# Patient Record
Sex: Female | Born: 1990 | Race: White | Hispanic: No | Marital: Single | Smoking: Former smoker
Health system: Southern US, Community
[De-identification: ages and names within clinical notes are randomized; demographics above are authoritative.]

## PROBLEM LIST (undated history)

## (undated) DIAGNOSIS — N390 Urinary tract infection, site not specified: Secondary | ICD-10-CM

## (undated) DIAGNOSIS — R569 Unspecified convulsions: Secondary | ICD-10-CM

## (undated) HISTORY — DX: Unspecified convulsions: R56.9

## (undated) HISTORY — PX: WISDOM TOOTH EXTRACTION: SHX21

## (undated) HISTORY — PX: NO PAST SURGERIES: SHX2092

## (undated) HISTORY — DX: Urinary tract infection, site not specified: N39.0

---
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
## 2008-03-20 ENCOUNTER — Emergency Department (HOSPITAL_COMMUNITY): Admission: EM | Admit: 2008-03-20 | Discharge: 2008-03-21 | Payer: Self-pay | Admitting: Emergency Medicine

## 2008-04-02 ENCOUNTER — Other Ambulatory Visit: Admission: RE | Admit: 2008-04-02 | Discharge: 2008-04-02 | Payer: Self-pay | Admitting: Family Medicine

## 2008-04-06 ENCOUNTER — Ambulatory Visit (HOSPITAL_COMMUNITY): Admission: RE | Admit: 2008-04-06 | Discharge: 2008-04-06 | Payer: Self-pay | Admitting: Family Medicine

## 2009-05-30 ENCOUNTER — Other Ambulatory Visit: Admission: RE | Admit: 2009-05-30 | Discharge: 2009-05-30 | Payer: Self-pay | Admitting: Family Medicine

## 2010-11-14 ENCOUNTER — Other Ambulatory Visit (HOSPITAL_COMMUNITY)
Admission: RE | Admit: 2010-11-14 | Discharge: 2010-11-14 | Disposition: A | Discharge status: Home / Self Care | Payer: BC Managed Care – PPO | Source: Ambulatory Visit | Attending: Family Medicine | Admitting: Family Medicine

## 2010-11-14 ENCOUNTER — Other Ambulatory Visit: Payer: Self-pay | Admitting: Physician Assistant

## 2010-11-14 DIAGNOSIS — Z113 Encounter for screening for infections with a predominantly sexual mode of transmission: Secondary | ICD-10-CM | POA: Insufficient documentation

## 2010-11-14 DIAGNOSIS — Z0000 Encounter for general adult medical examination without abnormal findings: Secondary | ICD-10-CM | POA: Insufficient documentation

## 2010-11-14 DIAGNOSIS — R87810 Cervical high risk human papillomavirus (HPV) DNA test positive: Secondary | ICD-10-CM | POA: Insufficient documentation

## 2010-12-29 ENCOUNTER — Other Ambulatory Visit: Payer: Self-pay | Admitting: Family Medicine

## 2012-05-09 ENCOUNTER — Other Ambulatory Visit (HOSPITAL_COMMUNITY)
Admission: RE | Admit: 2012-05-09 | Discharge: 2012-05-09 | Disposition: A | Discharge status: Home / Self Care | Payer: BC Managed Care – PPO | Source: Ambulatory Visit | Attending: Family Medicine | Admitting: Family Medicine

## 2012-05-09 ENCOUNTER — Other Ambulatory Visit: Payer: Self-pay | Admitting: Family Medicine

## 2012-05-09 DIAGNOSIS — Z113 Encounter for screening for infections with a predominantly sexual mode of transmission: Secondary | ICD-10-CM | POA: Insufficient documentation

## 2012-05-09 DIAGNOSIS — Z124 Encounter for screening for malignant neoplasm of cervix: Secondary | ICD-10-CM | POA: Insufficient documentation

## 2014-08-11 ENCOUNTER — Encounter (HOSPITAL_BASED_OUTPATIENT_CLINIC_OR_DEPARTMENT_OTHER): Payer: Self-pay | Admitting: *Deleted

## 2014-08-11 ENCOUNTER — Emergency Department (HOSPITAL_BASED_OUTPATIENT_CLINIC_OR_DEPARTMENT_OTHER)
Admission: EM | Admit: 2014-08-11 | Discharge: 2014-08-11 | Disposition: A | Discharge status: Home / Self Care | Payer: BC Managed Care – PPO | Attending: Emergency Medicine | Admitting: Emergency Medicine

## 2014-08-11 DIAGNOSIS — W01198A Fall on same level from slipping, tripping and stumbling with subsequent striking against other object, initial encounter: Secondary | ICD-10-CM | POA: Insufficient documentation

## 2014-08-11 DIAGNOSIS — Y92032 Bedroom in apartment as the place of occurrence of the external cause: Secondary | ICD-10-CM | POA: Diagnosis not present

## 2014-08-11 DIAGNOSIS — Y998 Other external cause status: Secondary | ICD-10-CM | POA: Insufficient documentation

## 2014-08-11 DIAGNOSIS — Z720 Tobacco use: Secondary | ICD-10-CM | POA: Insufficient documentation

## 2014-08-11 DIAGNOSIS — S060X0A Concussion without loss of consciousness, initial encounter: Secondary | ICD-10-CM | POA: Diagnosis not present

## 2014-08-11 DIAGNOSIS — S0990XA Unspecified injury of head, initial encounter: Secondary | ICD-10-CM

## 2014-08-11 DIAGNOSIS — Y9301 Activity, walking, marching and hiking: Secondary | ICD-10-CM | POA: Insufficient documentation

## 2014-08-11 NOTE — ED Provider Notes (Signed)
CSN: [id number]     Arrival date & time 08/11/14  1113 History   First MD Initiated Contact with Patient 08/11/14 1202     Chief Complaint  Patient presents with  . Fall     (Consider location/radiation/quality/duration/timing/severity/associated sxs/prior Treatment) HPI   23 year old female presents to ER for evaluation of a fall. Patient reports 2 nights ago she was walking around her bedroom in the dark, accidentally tripped over a pair of shoes and fell forward striking her head against the dresser. Denies any significant loss of consciousness. States she did have some mild tenderness to the left side of the scalp but without a severe laceration or bruising. She went to sleep however the next day she felt fatigued, nauseous and decided to not go to work. Today she felt much better however she would like to be evaluated to ensure any signs that she needs to keep an eye out for. At this time she denies any significant headache, vision changes, nausea, neck pain, numbness, weakness, difficulty thinking. Patient does not play any contact sports.  History reviewed. No pertinent past medical history. History reviewed. No pertinent past surgical history. History reviewed. No pertinent family history. History  Substance Use Topics  . Smoking status: Current Every Day Smoker -- 0.50 packs/day    Types: Cigarettes  . Smokeless tobacco: Not on file  . Alcohol Use: Yes     Comment: socially   OB History    No data available     Review of Systems  Constitutional: Negative for fever.  Skin: Negative for rash and wound.  Neurological: Positive for headaches. Negative for numbness.      Allergies  Dilantin  Home Medications   Prior to Admission medications   Not on File   BP 142/98 mmHg  Pulse 88  Temp(Src) 97.8 F (36.6 C) (Oral)  Resp 16  Ht 5\' 5"  (1.651 m)  Wt 135 lb (61.236 kg)  BMI 22.47 kg/m2  SpO2 100%  LMP 08/11/2014 Physical Exam  Constitutional: She is oriented  to person, place, and time. She appears well-developed and well-nourished. No distress.  HENT:  Head: Atraumatic.  Mild tenderness noted to her left parietal scalp region without any significant bruising, edema, or crepitus.  Eyes: Conjunctivae are normal.  Neck: Normal range of motion. Neck supple.  Neurological: She is alert and oriented to person, place, and time.  Neurologic exam:  Speech clear, pupils equal round reactive to light, extraocular movements intact  Normal peripheral visual fields Cranial nerves III through XII normal including no facial droop Follows commands, moves all extremities x4, normal strength to bilateral upper and lower extremities at all major muscle groups including grip Sensation normal to light touch  Coordination intact, no limb ataxia, finger-nose-finger normal Rapid alternating movements normal No pronator drift Gait normal   Skin: No rash noted.  Psychiatric: She has a normal mood and affect.  Nursing note and vitals reviewed.   ED Course  Procedures (including critical care time)  12:25 PM Patient here with minor head injury, likely concussion. Recommend avoid any activities that can cause a second impact. I also recommend avoiding any brain stimulating activities. Return precautions discussed. At this time I have low suspicion for intracranial hemorrhage or any other acute emergent medical condition. She is neurovascularly intact and mentating appropriately. Does not think advanced imaging is beneficial at this time.  Pt agrees.   Labs Review Labs Reviewed - No data to display  Imaging Review No results found.  EKG Interpretation None      MDM   Final diagnoses:  Minor head injury without loss of consciousness, initial encounter  Concussion, without loss of consciousness, initial encounter    BP 142/98 mmHg  Pulse 88  Temp(Src) 97.8 F (36.6 C) (Oral)  Resp 16  Ht 5\' 5"  (1.651 m)  Wt 135 lb (61.236 kg)  BMI 22.47 kg/m2   SpO2 100%  LMP 08/11/2014     Fayrene HelperBowie Ranald Alessio, PA-C 08/11/14 1228  Hilario Quarryanielle S [place], MD 08/11/14 1536

## 2014-08-11 NOTE — Discharge Instructions (Signed)
Concussion °A concussion is a brain injury. It is caused by: °· A hit to the head. °· A quick and sudden movement (jolt) of the head or neck. °A concussion is usually not life threatening. Even so, it can cause serious problems. If you had a concussion before, you may have concussion-like problems after a hit to your head. °HOME CARE °General Instructions °· Follow your doctor's directions carefully. °· Take medicines only as told by your doctor. °· Only take medicines your doctor says are safe. °· Do not drink alcohol until your doctor says it is okay. Alcohol and some drugs can slow down healing. They can also put you at risk for further injury. °· If you are having trouble remembering things, write them down. °· Try to do one thing at a time if you get distracted easily. For example, do not watch TV while making dinner. °· Talk to your family members or close friends when making important decisions. °· Follow up with your doctor as told. °· Watch your symptoms. Tell others to do the same. Serious problems can sometimes happen after a concussion. Older adults are more likely to have these problems. °· Tell your teachers, school nurse, school counselor, coach, athletic trainer, or work manager about your concussion. Tell them about what you can or cannot do. They should watch to see if: °¨ It gets even harder for you to pay attention or concentrate. °¨ It gets even harder for you to remember things or learn new things. °¨ You need more time than normal to finish things. °¨ You become annoyed (irritable) more than before. °¨ You are not able to deal with stress as well. °¨ You have more problems than before. °· Rest. Make sure you: °¨ Get plenty of sleep at night. °¨ Go to sleep early. °¨ Go to bed at the same time every day. Try to wake up at the same time. °¨ Rest during the day. °¨ Take naps when you feel tired. °· Limit activities where you have to think a lot or concentrate. These include: °¨ Doing  homework. °¨ Doing work related to a job. °¨ Watching TV. °¨ Using the computer. °Returning To Your Regular Activities °Return to your normal activities slowly, not all at once. You must give your body and brain enough time to heal.  °· Do not play sports or do other athletic activities until your doctor says it is okay. °· Ask your doctor when you can drive, ride a bicycle, or work other vehicles or machines. Never do these things if you feel dizzy. °· Ask your doctor about when you can return to work or school. °Preventing Another Concussion °It is very important to avoid another brain injury, especially before you have healed. In rare cases, another injury can lead to permanent brain damage, brain swelling, or death. The risk of this is greatest during the first 7-10 days after your injury. Avoid injuries by:  °· Wearing a seat belt when riding in a car. °· Not drinking too much alcohol. °· Avoiding activities that could lead to a second concussion (such as contact sports). °· Wearing a helmet when doing activities like: °¨ Biking. °¨ Skiing. °¨ Skateboarding. °¨ Skating. °· Making your home safer by: °¨ Removing things from the floor or stairways that could make you trip. °¨ Using grab bars in bathrooms and handrails by stairs. °¨ Placing non-slip mats on floors and in bathtubs. °¨ Improve lighting in dark areas. °GET HELP IF: °· It   gets even harder for you to pay attention or concentrate. °· It gets even harder for you to remember things or learn new things. °· You need more time than normal to finish things. °· You become annoyed (irritable) more than before. °· You are not able to deal with stress as well. °· You have more problems than before. °· You have problems keeping your balance. °· You are not able to react quickly when you should. °Get help if you have any of these problems for more than 2 weeks:  °· Lasting (chronic) headaches. °· Dizziness or trouble balancing. °· Feeling sick to your stomach  (nausea). °· Seeing (vision) problems. °· Being affected by noises or light more than normal. °· Feeling sad, low, down in the dumps, blue, gloomy, or empty (depressed). °· Mood changes (mood swings). °· Feeling of fear or nervousness about what may happen (anxiety). °· Feeling annoyed. °· Memory problems. °· Problems concentrating or paying attention. °· Sleep problems. °· Feeling tired all the time. °GET HELP RIGHT AWAY IF:  °· You have bad headaches or your headaches get worse. °· You have weakness (even if it is in one hand, leg, or part of the face). °· You have loss of feeling (numbness). °· You feel off balance. °· You keep throwing up (vomiting). °· You feel tired. °· One black center of your eye (pupil) is larger than the other. °· You twitch or shake violently (convulse). °· Your speech is not clear (slurred). °· You are more confused, easily angered (agitated), or annoyed than before. °· You have more trouble resting than before. °· You are unable to recognize people or places. °· You have neck pain. °· It is difficult to wake you up. °· You have unusual behavior changes. °· You pass out (lose consciousness). °MAKE SURE YOU:  °· Understand these instructions. °· Will watch your condition. °· Will get help right away if you are not doing well or get worse. °Document Released: 07/29/2009 Document Revised: 12/25/2013 Document Reviewed: 03/02/2013 °ExitCare® Patient Information ©2015 [employer]. This information is not intended to replace advice given to you by your health care provider. Make sure you discuss any questions you have with your health care provider. ° °

## 2014-08-11 NOTE — ED Notes (Signed)
Pt reports she fell Thursday pm and hit head on dresser- denies LOC- has been nauseated but no vomiting

## 2014-08-11 NOTE — ED Notes (Signed)
Pt reports that she fell on Thursday and hit her head on a dresser.  Reports nausea and dizziness yesterday.  Denies symptoms today.  A/Ox4.  Ambulatory.  PERRLA.

## 2014-08-11 NOTE — ED Notes (Signed)
Note for work given 

## 2015-06-11 ENCOUNTER — Other Ambulatory Visit (HOSPITAL_COMMUNITY)
Admission: RE | Admit: 2015-06-11 | Discharge: 2015-06-11 | Disposition: A | Discharge status: Home / Self Care | Payer: Self-pay | Source: Ambulatory Visit | Attending: Family Medicine | Admitting: Family Medicine

## 2015-06-11 ENCOUNTER — Other Ambulatory Visit: Payer: Self-pay | Admitting: Family Medicine

## 2015-06-11 DIAGNOSIS — Z113 Encounter for screening for infections with a predominantly sexual mode of transmission: Secondary | ICD-10-CM | POA: Insufficient documentation

## 2015-06-11 DIAGNOSIS — Z01419 Encounter for gynecological examination (general) (routine) without abnormal findings: Secondary | ICD-10-CM | POA: Insufficient documentation

## 2015-06-11 DIAGNOSIS — Z1151 Encounter for screening for human papillomavirus (HPV): Secondary | ICD-10-CM | POA: Insufficient documentation

## 2015-06-14 LAB — CYTOLOGY - PAP

## 2018-08-24 NOTE — L&D Delivery Note (Signed)
OB/GYN Faculty Practice Delivery Note  CLIFFORD COUDRIET is a 28 y.o. [id number] s/p vaginal delivery at [redacted]w[redacted]d. She was admitted for active labor.   ROM: 8h 52m with clear fluid GBS Status: negative Maximum Maternal Temperature: 101.2 F  Labor Progress: . Patient was admitted in active labor and progressed to 9 cm. She was augmented with pitocin, and subsequently progressed to complete  Delivery Date/Time: 07/02/19, 0157 Delivery: Called to room and patient was complete and pushing. Head delivered OA. No nuchal cord present. Shoulder and body delivered in usual fashion. Infant with spontaneous cry, placed on mother's abdomen, dried and stimulated. Cord clamped x 2 after 1-minute delay, and cut by FOB under my direct supervision. Cord blood drawn. Placenta delivered spontaneously with gentle cord traction. Fundus firm with massage and Pitocin. Labia, perineum, vagina, and cervix were inspected, a small second degree perineal and right labial laceration were noted. These were repaired with Vicryl in the usual fashion.   Placenta: 3 vessel cord, intact Complications: none Lacerations: 2nd degree perineal, right labial, repaired with Vicryl in the usual fashion EBL: 200 mL  Analgesia: epidural  Postpartum Planning [x]  message to sent to schedule follow-up  [x]  vaccines UTD  Infant: female  APGARs 8 and 9  weight per medical record  Merilyn Baba, DO OB/GYN Fellow, Faculty Practice

## 2019-02-07 ENCOUNTER — Telehealth: Payer: Self-pay | Admitting: Family Medicine

## 2019-02-07 NOTE — Telephone Encounter (Signed)
Attempted to call patient about her appointment on 6/17 @ 3:15. No answer, left voicemail instructing patient to be sure she has the [employer] before her appointment. Instructed that she is needs any assistance getting the app to give the office a call back. Screening not completed due to it being a virtual appointment.

## 2019-02-08 ENCOUNTER — Telehealth (INDEPENDENT_AMBULATORY_CARE_PROVIDER_SITE_OTHER): Payer: Self-pay | Admitting: *Deleted

## 2019-02-08 ENCOUNTER — Other Ambulatory Visit: Payer: Self-pay

## 2019-02-08 DIAGNOSIS — Z3490 Encounter for supervision of normal pregnancy, unspecified, unspecified trimester: Secondary | ICD-10-CM | POA: Insufficient documentation

## 2019-02-08 NOTE — Progress Notes (Signed)
I have reviewed the chart and agree with nursing staff's documentation of this patient's encounter.  Verita Schneiders, MD 02/08/2019 4:47 PM

## 2019-02-08 NOTE — Progress Notes (Signed)
2:34 I called [place] early to see if I could start her virtual visit. I left a voicemail I am calling early for her virtual visit and will call again closer to appointment time. Please be available virtually or by phone. [name],RN I connected with  Kathy Guerrero on 02/08/19 at  3:15 PM EDT by telephone and verified that I am speaking with the correct person using two identifiers.   I discussed the limitations, risks, security and privacy concerns of performing an evaluation and management service by telephone and virtually and the availability of in person appointments. I also discussed with the patient that there may be a patient responsible charge related to this service. The patient expressed understanding and agreed to proceed. She states she ususally has regular periods but she is unsure how far along she is because she had spotting/ short period in March and had a regular period sometime in February or January. States she went to [facility] and had [place] on 02/01/19 and they told her she was about [redacted]w[redacted]d then. I informed her we will do [place] asap to verify dating and do anatomy if she is 18 weeks or further. Given appointment for [place] for 02/15/19 after OB visit.  I also explained once we have her dating verified we will send her Babyscripts app. We also discussed that once her medicaid is active we will send her a bp cuff and she can check her bp weekly and enter into her Babyscripts app. I reviewed her new ob appt  / our location and instructed to wear mask. She voices understanding.   [name],RN 02/08/2019  3:06 PM

## 2019-02-15 ENCOUNTER — Other Ambulatory Visit (HOSPITAL_COMMUNITY)
Admission: RE | Admit: 2019-02-15 | Discharge: 2019-02-15 | Disposition: A | Discharge status: Home / Self Care | Payer: Medicaid Other | Source: Ambulatory Visit | Attending: Obstetrics & Gynecology | Admitting: Obstetrics & Gynecology

## 2019-02-15 ENCOUNTER — Ambulatory Visit (HOSPITAL_COMMUNITY)
Admission: RE | Admit: 2019-02-15 | Discharge: 2019-02-15 | Disposition: A | Discharge status: Home / Self Care | Payer: Medicaid Other | Source: Ambulatory Visit | Attending: Obstetrics and Gynecology | Admitting: Obstetrics and Gynecology

## 2019-02-15 ENCOUNTER — Ambulatory Visit (INDEPENDENT_AMBULATORY_CARE_PROVIDER_SITE_OTHER): Payer: Medicaid Other | Admitting: Obstetrics & Gynecology

## 2019-02-15 ENCOUNTER — Other Ambulatory Visit: Payer: Self-pay

## 2019-02-15 ENCOUNTER — Encounter: Payer: Self-pay | Admitting: Obstetrics & Gynecology

## 2019-02-15 VITALS — BP 128/79 | HR 90 | Temp 98.3°F | Wt 148.0 lb

## 2019-02-15 DIAGNOSIS — O9989 Other specified diseases and conditions complicating pregnancy, childbirth and the puerperium: Secondary | ICD-10-CM

## 2019-02-15 DIAGNOSIS — Z315 Encounter for genetic counseling: Secondary | ICD-10-CM | POA: Diagnosis not present

## 2019-02-15 DIAGNOSIS — R8271 Bacteriuria: Secondary | ICD-10-CM

## 2019-02-15 DIAGNOSIS — B9689 Other specified bacterial agents as the cause of diseases classified elsewhere: Secondary | ICD-10-CM

## 2019-02-15 DIAGNOSIS — Z3482 Encounter for supervision of other normal pregnancy, second trimester: Secondary | ICD-10-CM | POA: Diagnosis not present

## 2019-02-15 DIAGNOSIS — Z3492 Encounter for supervision of normal pregnancy, unspecified, second trimester: Secondary | ICD-10-CM | POA: Insufficient documentation

## 2019-02-15 DIAGNOSIS — Z363 Encounter for antenatal screening for malformations: Secondary | ICD-10-CM

## 2019-02-15 DIAGNOSIS — N760 Acute vaginitis: Secondary | ICD-10-CM

## 2019-02-15 DIAGNOSIS — Z3490 Encounter for supervision of normal pregnancy, unspecified, unspecified trimester: Secondary | ICD-10-CM | POA: Diagnosis not present

## 2019-02-15 DIAGNOSIS — Z3A20 20 weeks gestation of pregnancy: Secondary | ICD-10-CM

## 2019-02-15 DIAGNOSIS — E559 Vitamin D deficiency, unspecified: Secondary | ICD-10-CM

## 2019-02-15 DIAGNOSIS — O99891 Other specified diseases and conditions complicating pregnancy: Secondary | ICD-10-CM

## 2019-02-15 NOTE — Progress Notes (Signed)
History:   Kathy Guerrero is a 28 y.o. G3P0020 at 4545w3d by midtrimester ultrasound being seen today for her first obstetrical visit.  Her obstetrical history is significant for one previous SAB and TAB. Patient does intend to breast feed. Pregnancy history fully reviewed.  Patient reports no complaints.      HISTORY: OB History  Gravida Para Term Preterm AB Living  3 0 0 0 2 0  SAB TAB Ectopic Multiple Live Births  0 1 0 0 0    # Outcome Date GA Lbr Len/2nd Weight Sex Delivery Anes PTL Lv  3 Current           2 AB 2018          1 TAB             Last pap smear was done 2018 and was normal  Past Medical History:  Diagnosis Date  . Seizures (HCC)    as a child, none since then  . UTI (urinary tract infection)    History reviewed. No pertinent surgical history. Family History  Problem Relation Age of Onset  . Hypertension Father    Social History   Tobacco Use  . Smoking status: Former Smoker    Packs/day: 0.50    Types: Cigarettes    Quit date: 01/23/2019    Years since quitting: 0.0  . Smokeless tobacco: Never Used  Substance Use Topics  . Alcohol use: Yes    Comment: socially not since pregnancy  . Drug use: No   Allergies  Allergen Reactions  . Dilantin [Phenytoin]    Current Outpatient Medications on File Prior to Visit  Medication Sig Dispense Refill  . Prenatal Vit-Fe Fumarate-FA (PRENATAL VITAMINS PO) Take 1 tablet by mouth daily.     No current facility-administered medications on file prior to visit.     Review of Systems Pertinent items noted in HPI and remainder of comprehensive ROS otherwise negative. Physical Exam:   Vitals:   02/15/19 0847  BP: 128/79  Pulse: 90  Temp: 98.3 F (36.8 C)  Weight: 148 lb (67.1 kg)   Fetal Heart Rate (bpm): 145 Uterus:   19 week size  Pelvic Exam: Perineum: no hemorrhoids, normal perineum   Vulva: normal external genitalia, no lesions   Vagina:  normal mucosa, normal discharge   Cervix: no  lesions and normal, pap smear done.    Adnexa: normal adnexa and no mass, fullness, tenderness   Bony Pelvis: average  System: General: well-developed, well-nourished female in no acute distress   Breasts:  normal appearance, no masses or tenderness bilaterally   Skin: normal coloration and turgor, no rashes   Neurologic: oriented, normal, negative, normal mood   Extremities: normal strength, tone, and muscle mass, ROM of all joints is normal   HEENT PERRLA, extraocular movement intact and sclera clear, anicteric   Mouth/Teeth mucous membranes moist, pharynx normal without lesions and dental hygiene good   Neck supple and no masses   Cardiovascular: regular rate and rhythm   Respiratory:  no respiratory distress, normal breath sounds   Abdomen: soft, non-tender; bowel sounds normal; no masses,  no organomegaly       Assessment:    Pregnancy: [id number] Patient Active Problem List   Diagnosis Date Noted  . Supervision of low-risk pregnancy 02/08/2019    Plan:    Encounter for supervision of low-risk pregnancy in second trimester - Enroll patient in Babyscripts Program - Babyscripts Schedule Optimization - Obstetric Panel, Including HIV -  Culture, OB Urine - Genetic Screening - Cervicovaginal ancillary only - Cytology - PAP - Hemoglobin A1c - Comprehensive metabolic panel - TSH - VITAMIN D 25 Hydroxy (Vit-D Deficiency, Fractures) - AFP, Serum, Open Spina Bifida Initial labs drawn. Continue prenatal vitamins. Genetic Screening discussed, NIPS: ordered. Ultrasound discussed; fetal anatomic survey: to be done today.. Problem list reviewed and updated. The nature of [place] with multiple MDs and other Advanced Practice Providers was explained to patient; also emphasized that residents, students are part of our team. Routine obstetric precautions reviewed. Return in about 4 weeks (around 03/15/2019) for Virtual LOB Visit.      Verita Schneiders, MD, [place] for [employer], [place]

## 2019-02-15 NOTE — Patient Instructions (Signed)

## 2019-02-16 ENCOUNTER — Other Ambulatory Visit (HOSPITAL_COMMUNITY): Payer: Self-pay | Admitting: *Deleted

## 2019-02-16 DIAGNOSIS — Z362 Encounter for other antenatal screening follow-up: Secondary | ICD-10-CM

## 2019-02-16 LAB — CERVICOVAGINAL ANCILLARY ONLY
Bacterial vaginitis: POSITIVE — AB
Candida vaginitis: NEGATIVE
Chlamydia: NEGATIVE
Neisseria Gonorrhea: NEGATIVE
Trichomonas: NEGATIVE

## 2019-02-17 LAB — CYTOLOGY - PAP
Adequacy: ABSENT
Diagnosis: NEGATIVE

## 2019-02-17 LAB — CULTURE, OB URINE

## 2019-02-17 LAB — URINE CULTURE, OB REFLEX

## 2019-02-17 MED ORDER — METRONIDAZOLE 500 MG PO TABS: 500.0000 mg | ORAL_TABLET | Freq: Two times a day (BID) | ORAL | 0 refills | Status: DC

## 2019-02-17 NOTE — Addendum Note (Signed)
Addended by: Verita Schneiders A on: 02/17/2019 12:12 PM   Modules accepted: Orders

## 2019-02-18 LAB — AFP, SERUM, OPEN SPINA BIFIDA
AFP MoM: 0.95
AFP Value: 56.5 ng/mL
Gest. Age on Collection Date: 20.4 weeks
Maternal Age At EDD: 30 yr
OSBR Risk 1 IN: 10000
Test Results:: NEGATIVE
Weight: 148 [lb_av]

## 2019-02-18 LAB — COMPREHENSIVE METABOLIC PANEL
ALT: 17 IU/L (ref 0–32)
AST: 17 IU/L (ref 0–40)
Albumin/Globulin Ratio: 2.3 — ABNORMAL HIGH (ref 1.2–2.2)
Albumin: 4.3 g/dL (ref 3.9–5.0)
Alkaline Phosphatase: 50 IU/L (ref 39–117)
BUN/Creatinine Ratio: 11 (ref 9–23)
BUN: 5 mg/dL — ABNORMAL LOW (ref 6–20)
Bilirubin Total: <0.2 mg/dL (ref 0.0–1.2)
CO2: 18 mmol/L — ABNORMAL LOW (ref 20–29)
Calcium: 10.4 mg/dL — ABNORMAL HIGH (ref 8.7–10.2)
Chloride: 105 mmol/L (ref 96–106)
Creatinine, Ser: 0.46 mg/dL — ABNORMAL LOW (ref 0.57–1.00)
GFR calc Af Amer: 155 mL/min/{1.73_m2} (ref >59)
GFR calc non Af Amer: 135 mL/min/{1.73_m2} (ref >59)
Globulin, Total: 1.9 g/dL (ref 1.5–4.5)
Glucose: 87 mg/dL (ref 65–99)
Potassium: 4.5 mmol/L (ref 3.5–5.2)
Sodium: 133 mmol/L — ABNORMAL LOW (ref 134–144)
Total Protein: 6.2 g/dL (ref 6.0–8.5)

## 2019-02-18 LAB — OBSTETRIC PANEL, INCLUDING HIV
Antibody Screen: NEGATIVE
Basophils Absolute: 0.1 10*3/uL (ref 0.0–0.2)
Basos: 0 %
EOS (ABSOLUTE): 0.1 10*3/uL (ref 0.0–0.4)
Eos: 1 %
HIV Screen 4th Generation wRfx: NONREACTIVE
Hematocrit: 33.4 % — ABNORMAL LOW (ref 34.0–46.6)
Hemoglobin: 11.5 g/dL (ref 11.1–15.9)
Hepatitis B Surface Ag: NEGATIVE
Immature Grans (Abs): 0.1 10*3/uL (ref 0.0–0.1)
Immature Granulocytes: 1 %
Lymphocytes Absolute: 1.4 10*3/uL (ref 0.7–3.1)
Lymphs: 12 %
MCH: 31.9 pg (ref 26.6–33.0)
MCHC: 34.4 g/dL (ref 31.5–35.7)
MCV: 93 fL (ref 79–97)
Monocytes Absolute: 0.8 10*3/uL (ref 0.1–0.9)
Monocytes: 7 %
Neutrophils Absolute: 9.5 10*3/uL — ABNORMAL HIGH (ref 1.4–7.0)
Neutrophils: 79 %
Platelets: 313 10*3/uL (ref 150–450)
RBC: 3.61 x10E6/uL — ABNORMAL LOW (ref 3.77–5.28)
RDW: 11.6 % — ABNORMAL LOW (ref 11.7–15.4)
RPR Ser Ql: NONREACTIVE
Rh Factor: POSITIVE
Rubella Antibodies, IGG: 5.03 index (ref >0.99)
WBC: 11.9 10*3/uL — ABNORMAL HIGH (ref 3.4–10.8)

## 2019-02-18 LAB — TSH: TSH: 1.5 u[IU]/mL (ref 0.450–4.500)

## 2019-02-18 LAB — VITAMIN D 25 HYDROXY (VIT D DEFICIENCY, FRACTURES): Vit D, 25-Hydroxy: 22.7 ng/mL — ABNORMAL LOW (ref 30.0–100.0)

## 2019-02-18 LAB — HEMOGLOBIN A1C
Est. average glucose Bld gHb Est-mCnc: 97 mg/dL
Hgb A1c MFr Bld: 5 % (ref 4.8–5.6)

## 2019-02-20 DIAGNOSIS — E559 Vitamin D deficiency, unspecified: Secondary | ICD-10-CM | POA: Insufficient documentation

## 2019-02-20 MED ORDER — VITAMIN D (ERGOCALCIFEROL) 1.25 MG (50000 UNIT) PO CAPS: 50000.0000 [IU] | ORAL_CAPSULE | ORAL | 5 refills | Status: DC

## 2019-02-20 MED ORDER — CEFADROXIL 500 MG PO CAPS: 500.0000 mg | ORAL_CAPSULE | Freq: Two times a day (BID) | ORAL | 0 refills | Status: AC

## 2019-02-20 NOTE — Addendum Note (Signed)
Addended by: Verita Schneiders A on: 02/20/2019 10:24 AM   Modules accepted: Orders

## 2019-02-20 NOTE — Addendum Note (Signed)
Addended by: Verita Schneiders A on: 02/20/2019 10:16 AM   Modules accepted: Orders

## 2019-02-24 ENCOUNTER — Encounter: Payer: Self-pay | Admitting: Certified Nurse Midwife

## 2019-02-27 ENCOUNTER — Telehealth: Payer: Self-pay | Admitting: Family Medicine

## 2019-02-27 NOTE — Telephone Encounter (Signed)
Attempted to call patient about her appointment on 7/7 @ 1:00. No answer, left detailed voicemail with the appointment information as well as instructing patient to wear a face mask and no visitors are allowed with her. Patient was instructed if she has any symptoms not to attend her appointment, instead give the office a call back to be rescheduled. Symptom list and office number left.

## 2019-02-28 ENCOUNTER — Ambulatory Visit (INDEPENDENT_AMBULATORY_CARE_PROVIDER_SITE_OTHER): Payer: Medicaid Other | Admitting: General Practice

## 2019-02-28 ENCOUNTER — Encounter: Payer: Self-pay | Admitting: *Deleted

## 2019-02-28 ENCOUNTER — Other Ambulatory Visit: Payer: Self-pay

## 2019-02-28 DIAGNOSIS — B379 Candidiasis, unspecified: Secondary | ICD-10-CM | POA: Diagnosis not present

## 2019-02-28 LAB — POCT URINALYSIS DIP (DEVICE)
Bilirubin Urine: NEGATIVE
Glucose, UA: NEGATIVE mg/dL
Hgb urine dipstick: NEGATIVE
Ketones, ur: NEGATIVE mg/dL
Leukocytes,Ua: NEGATIVE
Nitrite: NEGATIVE
Protein, ur: NEGATIVE mg/dL
Specific Gravity, Urine: 1.025 (ref 1.005–1.030)
Urobilinogen, UA: 0.2 mg/dL (ref 0.0–1.0)
pH: 7 (ref 5.0–8.0)

## 2019-02-28 MED ORDER — TERCONAZOLE 0.4 % VA CREA: 1.0000 | TOPICAL_CREAM | Freq: Every day | VAGINAL | 0 refills | Status: DC

## 2019-02-28 NOTE — Progress Notes (Signed)
Patient seen and assessed by nursing staff during this encounter. I have reviewed the chart and agree with the documentation and plan.  Verita Schneiders, MD 02/28/2019 3:45 PM

## 2019-02-28 NOTE — Progress Notes (Signed)
Patient presents to office today reporting persistent UTI. Patient had a urine culture 6/24 as part of routine  prenatal care- UTI revealed on culture. Patient states she only had frequency at that time but thought it was due to pregnancy. Started antibiotic on 29th per Dr Harolyn [place]. Reports over the past weekend she noticed increase in frequency & an uncomfortable feeling of constantly being full. Patient also reports some vaginal itching around clitoris and vulva for the past 2 days. Rx for terazol 7 given recent antibiotic use. UA today is negative but will send urine for repeat culture today. Told patient results take a few days to come back but she will be notified via mychart of results. Patient verbalized understanding & had no questions.  Koren Bound RN BSN 02/28/19

## 2019-03-02 LAB — CULTURE, OB URINE

## 2019-03-02 LAB — URINE CULTURE, OB REFLEX

## 2019-03-09 ENCOUNTER — Other Ambulatory Visit: Payer: Self-pay

## 2019-03-09 DIAGNOSIS — Z3492 Encounter for supervision of normal pregnancy, unspecified, second trimester: Secondary | ICD-10-CM

## 2019-03-09 MED ORDER — AMBULATORY NON FORMULARY MEDICATION: 1.0000 | 0 refills | Status: DC

## 2019-03-09 NOTE — Progress Notes (Signed)
bp

## 2019-03-14 ENCOUNTER — Ambulatory Visit (HOSPITAL_COMMUNITY): Payer: Medicaid Other | Admitting: *Deleted

## 2019-03-14 ENCOUNTER — Other Ambulatory Visit: Payer: Self-pay

## 2019-03-14 ENCOUNTER — Ambulatory Visit (HOSPITAL_COMMUNITY)
Admission: RE | Admit: 2019-03-14 | Discharge: 2019-03-14 | Disposition: A | Discharge status: Home / Self Care | Payer: Medicaid Other | Source: Ambulatory Visit | Attending: Obstetrics and Gynecology | Admitting: Obstetrics and Gynecology

## 2019-03-14 ENCOUNTER — Encounter (HOSPITAL_COMMUNITY): Payer: Self-pay

## 2019-03-14 VITALS — BP 118/69 | HR 87 | Temp 98.6°F

## 2019-03-14 DIAGNOSIS — Z3A24 24 weeks gestation of pregnancy: Secondary | ICD-10-CM

## 2019-03-14 DIAGNOSIS — Z362 Encounter for other antenatal screening follow-up: Secondary | ICD-10-CM

## 2019-03-15 ENCOUNTER — Telehealth (INDEPENDENT_AMBULATORY_CARE_PROVIDER_SITE_OTHER): Payer: Medicaid Other | Admitting: Certified Nurse Midwife

## 2019-03-15 ENCOUNTER — Telehealth: Payer: Self-pay | Admitting: Certified Nurse Midwife

## 2019-03-15 VITALS — BP 104/63 | HR 84

## 2019-03-15 DIAGNOSIS — Z8659 Personal history of other mental and behavioral disorders: Secondary | ICD-10-CM

## 2019-03-15 DIAGNOSIS — Z3492 Encounter for supervision of normal pregnancy, unspecified, second trimester: Secondary | ICD-10-CM

## 2019-03-15 DIAGNOSIS — Z3A24 24 weeks gestation of pregnancy: Secondary | ICD-10-CM

## 2019-03-15 HISTORY — DX: Personal history of other mental and behavioral disorders: Z86.59

## 2019-03-15 NOTE — Progress Notes (Signed)
9:39a- Called pt for My Chart visit, no answer. Eft VM.  Called & pt answered

## 2019-03-15 NOTE — Telephone Encounter (Signed)
Attempted to call patient with her next ob appointment (8/19 @ 8:20). No answer, left voicemail with appointment information. Patient instructed to give the office a call back if needing to reschedule. Appointment reminders mailed.

## 2019-03-15 NOTE — Patient Instructions (Signed)
AREA PEDIATRIC/FAMILY PRACTICE PHYSICIANS  [facility] [address] [place], [place] [address] Phone - [phone]   Fax - [phone]  [facility] [address] [place], [place]  [address] Phone - [phone]   Fax - [phone]  [facility] [address] [place], [place]  [address] Phone - [phone]   Fax - [phone]  [facility] [address] [place], [place]  [address] Phone - [phone]   Fax - [phone]  [facility] [address] [place], [place]  [address] Phone - [phone]   Fax - [phone]  [facility] [address], [address] [place], [place]  [address] Phone - [phone]   Fax - [phone]  [facility] [address] [place], [place]  [address] Phone - [phone]   Fax - [phone]  [facility] [address], [address] [place], [place]  [address] Phone - [phone]   Fax - [phone]  [facility] [address] [place], [place]  [address] Phone - [phone]   Fax - [phone] [facility] [address] [place], [place]  [address] Phone - [phone]   Fax - [phone]  [facility] [address] [place], [place]  [address] Phone - [phone]   Fax - [phone]  [facility] [address] [place], [place]  [address] Phone - [phone]   Fax - [phone]  [facility] [address] [place], [place]  [address] Phone - [phone]   Fax - [phone]  [facility] [address] [place], [place]  [address] Phone - [phone]  [facility] [address] [place], [place]  [address] Phone - [phone]   Fax - [phone]  [facility] [address] [place], [place]  [address] Phone - [phone]   Fax - [phone]  [facility] [address] [place], [place]  [address] Phone - [phone]   Fax - [phone]  [facility] [address] [place], [place]  [address] Phone - [phone]   Fax - [phone]   [facility] [address] [place], [place]  [address] Phone - [phone]   Fax - [phone]  [facility] [address] [place], [place]  [address] Phone - [phone]   Fax - [phone]  [facility] [address] [place], [place]  [address] Phone - [phone]   Fax - [phone]  [facility] [address] , [place]  [address] Phone - [phone]   Fax - [phone]  [facility] [address] , [place]  [address] Phone - [phone]   Fax - [phone] [place] [facility] [address] [place], [place]  [address] Phone - [phone]   Fax - [phone]  [facility] [address] [place], [place]  [address] Phone - [phone]   Fax - [phone]  [facility] [address] [place], [place]  [address] Phone - [phone]   Fax - [phone]   

## 2019-03-15 NOTE — Progress Notes (Signed)
TELEHEALTH OBSTETRICS PRENATAL VIRTUAL VIDEO VISIT ENCOUNTER NOTE  [facility]   I connected with Kathy LongestAlexandria N Guerrero on 03/15/19 at  9:15 AM EDT by MyChart Video Encounter at home and verified that I am speaking with the correct person using two identifiers.   I discussed the limitations, risks, security and privacy concerns of performing an evaluation and management service virtually and the availability of in person appointments. I also discussed with the patient that there may be a patient responsible charge related to this service. The patient expressed understanding and agreed to proceed. Subjective:  Kathy Guerrero is a 28 y.o. G3P0020 at 7284w3d being seen today for ongoing prenatal care.  She is currently monitored for the following issues for this low-risk pregnancy and has Supervision of low-risk pregnancy; Vitamin D deficiency; and History of anxiety on their problem list.  Patient reports no complaints.  Contractions: Not present. Vag. Bleeding: None.  Movement: Present. Denies any leaking of fluid.   The following portions of the patient's history were reviewed and updated as appropriate: allergies, current medications, past family history, past medical history, past social history, past surgical history and problem list.   Objective:  There were no vitals filed for this visit.  Fetal Status:     Movement: Present     General:  Alert, oriented and cooperative. Patient is in no acute distress.  Respiratory: Normal respiratory effort, no problems with respiration noted  Mental Status: Normal mood and affect. Normal behavior. Normal judgment and thought content.  Remainder of physical exam deferred due to type of encounter  Imaging: Koreas Mfm Ob Comp + 14 Wk  Result Date: 02/16/2019 ----------------------------------------------------------------------  OBSTETRICS REPORT                        (Signed Final 02/16/2019  07:25 am) ---------------------------------------------------------------------- Patient Info  ID #:       [id number]                          D.O.B.:  08/11/1991 (27 yrs)  Name:       Kathy Guerrero            Visit Date: 02/15/2019 02:17 pm ---------------------------------------------------------------------- Performed By  Performed By:     Kathy MealyJovancia Guerrero        Ref. Address:      [address]                    [address]                                                              [address]                                                              [place], [place]  [address]  Attending:        Lin Guerrero      [facility]  Referred By:      Kathy Newcomer MD ---------------------------------------------------------------------- Orders   #  Description                          Code         Ordered By   1  [place] MFM OB COMP + 14 WK               76805.01     Kathy Guerrero  ----------------------------------------------------------------------   #  Order #                    Accession #                 Episode #   1  [id number]                  [id number]                  [id number]  ---------------------------------------------------------------------- Indications   Encounter for antenatal screening for          Z36.3   malformations ( Panorama Done 02/15/19)   [redacted] weeks gestation of pregnancy                Z3A.20  ---------------------------------------------------------------------- Vital Signs  Weight (lb): 148                               Height:        5'5"  BMI:         24.63 ---------------------------------------------------------------------- Fetal Evaluation  Num Of Fetuses:          1  Fetal Heart Rate(bpm):   141  Cardiac Activity:        Observed  Presentation:            Cephalic  Placenta:                Posterior  P. Cord  Insertion:       Visualized  Amniotic Fluid  AFI FV:      Within normal limits                              Largest Pocket(cm)                              5.73 ---------------------------------------------------------------------- Biometry  BPD:      49.4  mm     G. Age:  21w 0d         71  %    CI:        79.43   %    70 - 86  FL/HC:       18.9  %    16.8 - 19.8  HC:      175.2  mm     G. Age:  20w 0d         24  %    HC/AC:       1.11       1.09 - 1.39  AC:      157.3  mm     G. Age:  20w 6d         59  %    FL/BPD:      67.0  %  FL:       33.1  mm     G. Age:  20w 2d         39  %    FL/AC:       21.0  %    20 - 24  HUM:      28.8  mm     G. Age:  19w 2d         22  %  CER:      19.9  mm     G. Age:  19w 0d         16  %  NFT:       5.1  mm  LV:        4.8  mm  CM:        2.7  mm  Est. FW:     364   gm   0 lb 13 oz      54  % ---------------------------------------------------------------------- OB History  Gravidity:    3  TOP:          2        Living:  0 ---------------------------------------------------------------------- Gestational Age  U/S Today:     20w 4d                                        EDD:   07/01/19  Best:          Kathy Guerrero 3d     Det. By:  Previous Ultrasound      EDD:   07/02/19                                      (02/01/19) ---------------------------------------------------------------------- Anatomy  Cranium:               Appears normal         Aortic Arch:            Appears normal  Cavum:                 Appears normal         Ductal Arch:            Not well visualized  Ventricles:            Appears normal         Diaphragm:              Appears normal  Choroid Plexus:        Appears normal         Stomach:                Appears normal, left  sided  Cerebellum:            Appears normal         Abdomen:                Appears normal  Posterior Fossa:        Appears normal         Abdominal Wall:         Appears nml (cord                                                                        insert, abd wall)  Nuchal Fold:           Not applicable (>20    Cord Vessels:           Not well visualized                         wks GA)  Face:                  Orbits and profile     Kidneys:                Appear normal                         previously seen  Lips:                  Appears normal         Bladder:                Appears normal  Thoracic:              Appears normal         Spine:                  Not well visualized  Heart:                 Not well visualized    Upper Extremities:      Appears normal  RVOT:                  Not well visualized    Lower Extremities:      Appears normal  LVOT:                  Not well visualized  Other:  Nasal bone visualized. Female gender Technically difficult due to fetal          position. ---------------------------------------------------------------------- Cervix Uterus Adnexa  Cervix  Length:           3.93  cm.  Normal appearance by transabdominal scan.  Uterus  No abnormality visualized.  Left Ovary  Within normal limits.  Right Ovary  Within normal limits.  Cul De Sac  No free fluid seen.  Adnexa  No abnormality visualized. No adnexal mass  visualized. No free fluid. ---------------------------------------------------------------------- Impression  Normal interval growth.  No ultrasonic evidence of structural  fetal anomalies.  Suboptimal views of the fetal anatomy were obtained  secondary to fetal position. ---------------------------------------------------------------------- Recommendations  Follow up anatomy in 4 weeks. ----------------------------------------------------------------------  Kathy Landsmanorenthian Booker, MD Electronically Signed Final Report   02/16/2019 07:25 am ----------------------------------------------------------------------  [place] Mfm Ob Follow Up  Result Date:  03/14/2019 ----------------------------------------------------------------------  OBSTETRICS REPORT                       (Signed Final 03/14/2019 09:13 am) ---------------------------------------------------------------------- Patient Info  ID #:       [id number]                          D.O.B.:  [birth year] (27 yrs)  Name:       Kathy Guerrero            Visit Date: 03/14/2019 08:33 am ---------------------------------------------------------------------- Performed By  Performed By:     Rennie PlowmanErica Lyskawa          Ref. Address:     [address]                    [address]                                                             Road                                                             [place], [place]                                                             [id number]  Attending:        Noralee Spaceavi Shankar MD        [facility]  Referred By:      Kathy NewcomerUGONNA A                    ANYANWU MD ---------------------------------------------------------------------- Orders   #  Description                          Code         Ordered By   1  US MFM OB FOLLOW UP                  [id number]     Bettey CostaORENTHIAN  BOOKER  ----------------------------------------------------------------------   #  Order #                    Accession #                 Episode #   1  [id number]                  [id number]                  [id number]  ---------------------------------------------------------------------- Indications   [redacted] weeks gestation of pregnancy                Z3A.24   Encounter for antenatal screening for          Z36.3   malformations ( Panorama Done 02/15/19)  ---------------------------------------------------------------------- Vital Signs  Weight (lb): 148                               Height:        5'5"  BMI:         24.63  ---------------------------------------------------------------------- Fetal Evaluation  Num Of Fetuses:         1  Fetal Heart Rate(bpm):  150  Cardiac Activity:       Observed  Presentation:           Cephalic  Placenta:               Posterior  P. Cord Insertion:      Visualized, central  Amniotic Fluid  AFI FV:      Within normal limits                              Largest Pocket(cm)                              4.33 ---------------------------------------------------------------------- Biometry  BPD:      57.2  mm     G. Age:  23w 4d         17  %    CI:        70.88   %    70 - 86                                                          FL/HC:      19.2   %    18.7 - 20.9  HC:      216.5  mm     G. Age:  23w 5d         13  %    HC/AC:      1.09        1.05 - 1.21  AC:       199   mm     G. Age:  24w 4d         50  %    FL/BPD:     72.7   %    71 - 87  FL:       41.6  mm     G. Age:  23w 4d  16  %    FL/AC:      20.9   %    20 - 24  HUM:      38.3  mm     G. Age:  23w 4d         24  %  LV:        4.3  mm  Est. FW:     654  gm      1 lb 7 oz     30  % ---------------------------------------------------------------------- OB History  Gravidity:    3  TOP:          2        Living:  0 ---------------------------------------------------------------------- Gestational Age  U/S Today:     23w 6d                                        EDD:   07/05/19  Best:          24w 2d     Det. By:  Previous Ultrasound      EDD:   07/02/19                                      (02/01/19) ---------------------------------------------------------------------- Anatomy  Cranium:               Appears normal         LVOT:                   Appears normal  Cavum:                 Appears normal         Aortic Arch:            Appears normal  Ventricles:            Appears normal         Ductal Arch:            Appears normal  Choroid Plexus:        Appears normal         Diaphragm:              Appears normal  Cerebellum:             Appears normal         Stomach:                Appears normal, left                                                                        sided  Posterior Fossa:       Appears normal         Abdomen:                Appears normal  Nuchal Fold:           Not applicable (>31    Abdominal Wall:         Appears nml (cord  wks GA)                                        insert, abd wall)  Face:                  Appears normal         Cord Vessels:           Appears normal (3                         (orbits and profile)                           vessel cord)  Lips:                  Previously seen        Kidneys:                Appear normal  Palate:                Appears normal         Bladder:                Appears normal  Thoracic:              Appears normal         Spine:                  Appears normal  Heart:                 Appears normal         Upper Extremities:      Previously seen                         (4CH, axis, and                         situs)  RVOT:                  Appears normal         Lower Extremities:      Previously seen  Other:  Fetus appears to be female. Nasal bone visualized. Heels and 5th          digit visualized. ---------------------------------------------------------------------- Cervix Uterus Adnexa  Cervix  Normal appearance by transabdominal scan.  Left Ovary  Not visualized.  Right Ovary  Within normal limits.  Adnexa  No abnormality visualized. ---------------------------------------------------------------------- Impression   Patient returned for completion of fetal anatomy. Amniotic  fluid is normal and good fetal activity is seen. Fetal growth is  appropriate for gestational age. Fetal anatomical survey was  completed and appears normal. ---------------------------------------------------------------------- Recommendations  Follow-up scans as clinically indicated. ----------------------------------------------------------------------                   Noralee Space, MD Electronically Signed Final Report   03/14/2019 09:13 am ----------------------------------------------------------------------   Assessment and Plan:  Pregnancy: G3P0020 at [redacted]w[redacted]d 1. Encounter for supervision of low-risk pregnancy in second trimester - GTT next visit  2. History of anxiety - managing well w/o meds  Preterm labor symptoms and general obstetric precautions including but not limited to vaginal bleeding, contractions, leaking of fluid and fetal movement were reviewed in  detail with the patient. I discussed the assessment and treatment plan with the patient. The patient was provided an opportunity to ask questions and all were answered. The patient agreed with the plan and demonstrated an understanding of the instructions. The patient was advised to call back or seek an in-person office evaluation/[facility] for any urgent or concerning symptoms. Please refer to After Visit Summary for other counseling recommendations.   I provided 11 minutes of face-to-face time during this encounter.  Return in about 4 weeks (around 04/12/2019).  Future Appointments  Date Time Provider Department Center  04/12/2019  8:20 AM [facility]  04/12/2019  9:15 AM Marny LowensteinWenzel, Julie N, PA-C [facility]    Donette LarryMelanie Agamjot Kilgallon, CNM [facility]

## 2019-03-31 DIAGNOSIS — Z3493 Encounter for supervision of normal pregnancy, unspecified, third trimester: Secondary | ICD-10-CM

## 2019-04-03 DIAGNOSIS — Z3490 Encounter for supervision of normal pregnancy, unspecified, unspecified trimester: Secondary | ICD-10-CM | POA: Diagnosis not present

## 2019-04-03 MED ORDER — BLOOD PRESSURE KIT DEVI: 1.0000 | Freq: Once | 0 refills | Status: AC

## 2019-04-11 ENCOUNTER — Telehealth: Payer: Self-pay | Admitting: Medical

## 2019-04-11 NOTE — Telephone Encounter (Signed)
Called the patient and left a detailed voicemail of the upcoming appointment. Informed the patient if she has been diagnosed with covid19, in close contact with someone who has had covid19, or experienced any flu-like symptoms such as fever, rash, sore throat, or shortness of breath please call our office to reschedule the appointment. Also advised the patient of no eating or drinking after midnight tonight. If you have any questions or concerns please give our office a call at [phone]. °

## 2019-04-12 ENCOUNTER — Other Ambulatory Visit: Payer: Medicaid Other

## 2019-04-12 ENCOUNTER — Other Ambulatory Visit: Payer: Self-pay

## 2019-04-12 ENCOUNTER — Ambulatory Visit (INDEPENDENT_AMBULATORY_CARE_PROVIDER_SITE_OTHER): Payer: Medicaid Other | Admitting: Medical

## 2019-04-12 ENCOUNTER — Other Ambulatory Visit: Payer: Self-pay | Admitting: General Practice

## 2019-04-12 VITALS — BP 117/68 | HR 81 | Temp 97.7°F | Wt 167.2 lb

## 2019-04-12 DIAGNOSIS — Z23 Encounter for immunization: Secondary | ICD-10-CM

## 2019-04-12 DIAGNOSIS — E559 Vitamin D deficiency, unspecified: Secondary | ICD-10-CM | POA: Diagnosis not present

## 2019-04-12 DIAGNOSIS — Z3A28 28 weeks gestation of pregnancy: Secondary | ICD-10-CM | POA: Diagnosis not present

## 2019-04-12 DIAGNOSIS — Z3493 Encounter for supervision of normal pregnancy, unspecified, third trimester: Secondary | ICD-10-CM | POA: Diagnosis not present

## 2019-04-12 NOTE — Patient Instructions (Addendum)
Fetal Movement Counts Patient Name: ________________________________________________ Patient Due Date: ____________________ What is a fetal movement count?  A fetal movement count is the number of times that you feel your baby move during a certain amount of time. This may also be called a fetal kick count. A fetal movement count is recommended for every pregnant woman. You may be asked to start counting fetal movements as early as week 28 of your pregnancy. Pay attention to when your baby is most active. You may notice your baby's sleep and wake cycles. You may also notice things that make your baby move more. You should do a fetal movement count:  When your baby is normally most active.  At the same time each day. A good time to count movements is while you are resting, after having something to eat and drink. How do I count fetal movements? 1. Find a quiet, comfortable area. Sit, or lie down on your side. 2. Write down the date, the start time and stop time, and the number of movements that you felt between those two times. Take this information with you to your health care visits. 3. For 2 hours, count kicks, flutters, swishes, rolls, and jabs. You should feel at least 10 movements during 2 hours. 4. You may stop counting after you have felt 10 movements. 5. If you do not feel 10 movements in 2 hours, have something to eat and drink. Then, keep resting and counting for 1 hour. If you feel at least 4 movements during that hour, you may stop counting. Contact a health care provider if:  You feel fewer than 4 movements in 2 hours.  Your baby is not moving like he or she usually does. Date: ____________ Start time: ____________ Stop time: ____________ Movements: ____________ Date: ____________ Start time: ____________ Stop time: ____________ Movements: ____________ Date: ____________ Start time: ____________ Stop time: ____________ Movements: ____________ Date: ____________ Start time:  ____________ Stop time: ____________ Movements: ____________ Date: ____________ Start time: ____________ Stop time: ____________ Movements: ____________ Date: ____________ Start time: ____________ Stop time: ____________ Movements: ____________ Date: ____________ Start time: ____________ Stop time: ____________ Movements: ____________ Date: ____________ Start time: ____________ Stop time: ____________ Movements: ____________ Date: ____________ Start time: ____________ Stop time: ____________ Movements: ____________ This information is not intended to replace advice given to you by your health care provider. Make sure you discuss any questions you have with your health care provider. Document Released: 09/09/2006 Document Revised: 08/30/2018 Document Reviewed: 09/19/2015 Elsevier Patient Education  2020 Elsevier Inc. [employer] of the uterus can occur throughout pregnancy, but they are not always a sign that you are in labor. You may have practice contractions called Braxton Hicks contractions. These false labor contractions are sometimes confused with true labor. What are [place] Circle contractions? Braxton Hicks contractions are tightening movements that occur in the muscles of the uterus before labor. Unlike true labor contractions, these contractions do not result in opening (dilation) and thinning of the cervix. Toward the end of pregnancy (32-34 weeks), Braxton Hicks contractions can happen more often and may become stronger. These contractions are sometimes difficult to tell apart from true labor because they can be very uncomfortable. You should not feel embarrassed if you go to the hospital with false labor. Sometimes, the only way to tell if you are in true labor is for your health care provider to look for changes in the cervix. The health care provider will do a physical exam and may monitor your contractions. If you  are not in true labor, the exam should show  that your cervix is not dilating and your water has not broken. If there are no other health problems associated with your pregnancy, it is completely safe for you to be sent home with false labor. You may continue to have Braxton Hicks contractions until you go into true labor. How to tell the difference between true labor and false labor True labor  Contractions last 30-70 seconds.  Contractions become very regular.  Discomfort is usually felt in the top of the uterus, and it spreads to the lower abdomen and low back.  Contractions do not go away with walking.  Contractions usually become more intense and increase in frequency.  The cervix dilates and gets thinner. False labor  Contractions are usually shorter and not as strong as true labor contractions.  Contractions are usually irregular.  Contractions are often felt in the front of the lower abdomen and in the groin.  Contractions may go away when you walk around or change positions while lying down.  Contractions get weaker and are shorter-lasting as time goes on.  The cervix usually does not dilate or become thin. Follow these instructions at home:   Take over-the-counter and prescription medicines only as told by your health care provider.  Keep up with your usual exercises and follow other instructions from your health care provider.  Eat and drink lightly if you think you are going into labor.  If Braxton Hicks contractions are making you uncomfortable: ? Change your position from lying down or resting to walking, or change from walking to resting. ? Sit and rest in a tub of warm water. ? Drink enough fluid to keep your urine pale yellow. Dehydration may cause these contractions. ? Do slow and deep breathing several times an hour.  Keep all follow-up prenatal visits as told by your health care provider. This is important. Contact a health care provider if:  You have a fever.  You have continuous pain in  your abdomen. Get help right away if:  Your contractions become stronger, more regular, and closer together.  You have fluid leaking or gushing from your vagina.  You pass blood-tinged mucus (bloody show).  You have bleeding from your vagina.  You have low back pain that you never had before.  You feel your babys head pushing down and causing pelvic pressure.  Your baby is not moving inside you as much as it used to. Summary  Contractions that occur before labor are called Braxton Hicks contractions, false labor, or practice contractions.  Braxton Hicks contractions are usually shorter, weaker, farther apart, and less regular than true labor contractions. True labor contractions usually become progressively stronger and regular, and they become more frequent.  Manage discomfort from [facility] contractions by changing position, resting in a warm bath, drinking plenty of water, or practicing deep breathing. This information is not intended to replace advice given to you by your health care provider. Make sure you discuss any questions you have with your health care provider. Document Released: 12/24/2016 Document Revised: 07/23/2017 Document Reviewed: 12/24/2016 Elsevier Patient Education  2020 [employer] Education Options: [facility] Department Classes:  Childbirth education classes can help you get ready for a positive parenting experience. You can also meet other expectant parents and get free stuff for your baby. Each class runs for five weeks on the same night and costs $45 for the mother-to-be and her support person. Medicaid covers the cost  if you are eligible. Call [phone] to register. [facility] Childbirth Education:  [phone] or [phone] or [email or web address]  Baby & Me Class: Discuss newborn & infant parenting and family adjustment issues with other new mothers in a relaxed environment. Each week brings a new speaker  or baby-centered activity. We encourage new mothers to join [place] every Thursday at 11:00am. Babies birth until crawling. No registration or fee. Daddy [employer]: This course offers Dads-to-be the tools and knowledge needed to feel confident on their journey to becoming new fathers. Experienced dads, who have been trained as coaches, teach dads-to-be how to hold, comfort, diaper, swaddle and play with their infant while being able to support the new mom as well. A class for men taught by men. $25/dad Big Brother/Big Sister: Let your children share in the joy of a new brother or sister in this special class designed just for them. Class includes discussion about how families care for babies: swaddling, holding, diapering, safety as well as how they can be helpful in their new role. This class is designed for children ages 17 to 20, but any age is welcome. Please register each child individually. $5/child  Mom Talk: This mom-led group offers support and connection to mothers as they journey through the adjustments and struggles of that sometimes overwhelming first year after the birth of a child. Tuesdays at 10:00am and Thursdays at 6:00pm. Babies welcome. No registration or fee. Breastfeeding Support Group: This group is a mother-to-mother support circle where moms have the opportunity to share their breastfeeding experiences. A Lactation Consultant is present for questions and concerns. Meets each Tuesday at 11:00am. No fee or registration. Breastfeeding Your Baby: Learn what to expect in the first days of breastfeeding your newborn.  This class will help you feel more confident with the skills needed to begin your breastfeeding experience. Many new mothers are concerned about breastfeeding after leaving the hospital. This class will also address the most common fears and challenges about breastfeeding during the first few weeks, months and beyond. (call for fee) Comfort Techniques and Tour: This 2 hour interactive  class will provide you the opportunity to learn & practice hands-on techniques that can help relieve some of the discomfort of labor and encourage your baby to rotate toward the best position for birth. You and your partner will be able to try a variety of labor positions with birth balls and rebozos as well as practice breathing, relaxation, and visualization techniques. A tour of the [facility] is included with this class. $20 per registrant and support person Childbirth Class- Weekend Option: This class is a Weekend version of our Birth & Baby series. It is designed for parents who have a difficult time fitting several weeks of classes into their schedule. It covers the care of your newborn and the basics of labor and childbirth. It also includes a [place] [facility] and lunch. The class is held two consecutive days: beginning on Friday evening from 6:30 - 8:30 p.m. and the next day, Saturday from 9 a.m. - 4 p.m. (call for fee) Doren Custard Class: Interested in a waterbirth?  This informational class will help you discover whether waterbirth is the right fit for you. Education about waterbirth itself, supplies you would need and how to assemble your support team is what you can expect from this class. Some obstetrical practices require this class in order to pursue a waterbirth. (Not all obstetrical practices offer waterbirth-check with your healthcare provider.)  Register only the expectant mom, but you are encouraged to bring your partner to class! Required if planning waterbirth, no fee. Infant/Child CPR: Parents, grandparents, babysitters, and friends learn Cardio-Pulmonary Resuscitation skills for infants and children. You will also learn how to treat both conscious and unconscious choking in infants and children. This Family & Friends program does not offer certification. Register each participant individually to ensure that enough mannequins are  available. (Call for fee) Grandparent Love: Expecting a grandbaby? This class is for you! Learn about the latest infant care and safety recommendations and ways to support your own child as he or she transitions into the parenting role. Taught by Registered Nurses who are childbirth instructors, but most importantly...they are grandmothers too! $10/person. Childbirth Class- Natural Childbirth: This series of 5 weekly classes is for expectant parents who want to learn and practice natural methods of coping with the process of labor and childbirth. Relaxation, breathing, massage, visualization, role of the partner, and helpful positioning are highlighted. Participants learn how to be confident in their body's ability to give birth. This class will empower and help parents make informed decisions about their own care. Includes discussion that will help new parents transition into the immediate postpartum period. [facility] is included. We suggest taking this class between 25-32 weeks, but it's only a recommendation. $75 per registrant and one support person or $30 Medicaid. Childbirth Class- 3 week Series: This option of 3 weekly classes helps you and your labor partner prepare for childbirth. Newborn care, labor & birth, cesarean birth, pain management, and comfort techniques are discussed and a [place] [facility] is included. The class meets at the same time, on the same day of the week for 3 consecutive weeks beginning with the starting date you choose. $60 for registrant and one support person.  Marvelous Multiples: Expecting twins, triplets, or more? This class covers the differences in labor, birth, parenting, and breastfeeding issues that face multiples parents. NICU tour is included. Led by a Certified Childbirth Educator who is the mother of twins. No fee. Caring for Baby: This class is for expectant and adoptive parents who want to learn  and practice the most up-to-date newborn care for their babies. Focus is on birth through the first six weeks of life. Topics include feeding, bathing, diapering, crying, umbilical cord care, circumcision care and safe sleep. Parents learn to recognize symptoms of illness and when to call the pediatrician. Register only the mom-to-be and your partner or support person can plan to come with you! $10 per registrant and support person Childbirth Class- online option: This online class offers you the freedom to complete a Birth and Baby series in the comfort of your own home. The flexibility of this option allows you to review sections at your own pace, at times convenient to you and your support people. It includes additional video information, animations, quizzes, and extended activities. Get organized with helpful eClass tools, checklists, and trackers. Once you register online for the class, you will receive an email within a few days to accept the invitation and begin the class when the time is right for you. The content will be available to you for 60 days. $60 for 60 days of online access for you and your support people.  Local Doulas: Natural Baby Doulas [email or web address] Tel: [phone] [email or web address] [employer] [phone] [email or web address] .[email or web address] The Labor Hassell Halim  (also do waterbirth tub rental) [phone] [email or web address] Triad Birth Doula [phone] [email or web address]  NotebookDistributors.fi [facility] Rhythms  [phone] https://[email or web address]-[email or web address]/ [employer] (PADA) [email or web address]/ [place] Birth and Baby  [email or web address]/ Considering Waterbirth? Guide for patients at [facility] for [facility]  Why consider waterbirth?   Gentle birth for babies  Less pain medicine used  in labor  May allow for passive descent/less pushing  May reduce perineal tears   More mobility and instinctive maternal position changes  Increased maternal relaxation  Reduced blood pressure in labor  Is waterbirth safe? What are the risks of infection, drowning or other complications?   Infection: o Very low risk (3.7 % for tub vs 4.8% for bed) o 7 in 8000 waterbirths with documented infection o Poorly cleaned equipment most common cause o Slightly lower group B strep transmission rate   Drowning o Maternal:  - Very low risk   - Related to seizures or fainting o Newborn:  - Very low risk. No evidence of increased risk of respiratory problems in multiple large studies - Physiological protection from breathing under water - Avoid underwater birth if there are any fetal complications - Once babys head is out of the water, keep it out.   Birth complication o Some reports of cord trauma, but risk decreased by bringing baby to surface gradually o No evidence of increased risk of shoulder dystocia. Mothers can usually change positions faster in water than in a bed, possibly aiding the maneuvers to free the shoulder.   You must attend a Doren Custard class at [facility]  3rd Wednesday of every month from 7-9pm  [employer] by calling [phone] or online at [email or web address]  Bring [place] the certificate from the class to your prenatal appointment  Meet with a midwife at 36 weeks to see if you can still plan a waterbirth and to sign the consent.   Purchase or rent the following supplies:   Water Birth Pool (Birth Pool in a Box or [place] for instance)  (Tubs start ~$125)  Single-use disposable tub liner designed for your brand of tub  New garden hose labeled "lead-free", suitable for drinking water",  Electric drain pump to remove water (We recommend 792 gallon per hour or greater pump.)   Separate garden hose to remove the dirty water  Fish  net  Bathing suit top (optional)  Long-handled mirror (optional)  Places to purchase or rent supplies  GotWebTools.is for tub purchases and supplies  Waterbirthsolutions.com for tub purchases and supplies  The Labor Ladies (www.[email or web address]) $275 for tub rental/set-up & take down/kit   [employer] (http://www.fleming.com/.htm) Information regarding doulas (labor support) who provide pool rentals  Our practice has a Birth Pool in a Box tub at the hospital that you may borrow on a first-come-first-served basis. It is your responsibility to to set up, clean and break down the tub. We cannot guarantee the availability of this tub in advance. You are responsible for bringing all accessories listed above. If you do not have all necessary supplies you cannot have a waterbirth.    Things that would prevent you from having a waterbirth:  Premature, <37wks  Previous cesarean birth  Presence of thick meconium-stained fluid  Multiple gestation (Twins, triplets, etc.)  Uncontrolled diabetes or gestational diabetes requiring medication  Hypertension requiring medication or diagnosis of pre-eclampsia  Heavy vaginal bleeding  Non-reassuring fetal heart rate  Active infection (MRSA, etc.). Group B Strep is NOT a contraindication for  waterbirth.  If your labor has to be induced and induction method requires continuous  monitoring of the  baby's heart rate  Other risks/issues identified by your obstetrical provider  Please remember that birth is unpredictable. Under certain unforeseeable circumstances your provider may advise against giving birth in the tub. These decisions will be made on a case-by-case basis and with the safety of you and your baby as our highest priority.  AREA PEDIATRIC/FAMILY PRACTICE PHYSICIANS  [place] [phone])  [facility] Davy Pique, MD; Gwendlyn Deutscher, MD; Walker Kehr, MD; Andria Frames, MD; McDiarmid,  MD; Dutch Quint, MD; Nori Riis, MD; [place], [address] [place], [place] [address] o [phone] o Mon-Fri 8:30-12:30, 1:30-5:00 o Providers come to see babies at [place] at [place] providers who accept newborns: Dorthy Cooler, MD; Orland Mustard, MD; Stephanie Acre, MD o [place], [place], [place] [address] o [phone] o Mon-Fri 8:00-5:30 o Babies seen by providers at [facility] o Does NOT accept Medicaid o Please call early in hospitalization for appointment (limited availability)   Roberts, MD o [address] [place], [place] [address] o [phone] o Mon, Tue, Thur, Fri 8:30-5:00, Wed 10:00-7:00 (closed 1-2pm) o Babies seen by [facility] providers o Accepting Medicaid  [place], MD o [place], [place], [place] [address] o [phone] o Mon-Fri 8:30-5:00, Sat 8:30-12:00 o Provider comes to see babies at [facility] o Accepting Medicaid o Must have been referred from current patients or contacted office prior to delivery  [facility] ([place] for [place]) Franne Forts, MD; Tamera Punt, MD; Doneen Poisson, MD; Fatima Sanger, MD; Wynetta Emery, MD; Jess Barters, MD; Tami Ribas, MD; Herbert Moors, MD; Derrell Lolling, MD; Dorothyann Peng, MD; Lucious Groves, NP; Baldo Ash, NP o [address], [place], [place] [address] o [phone] o Mon, Tue, Thur, Fri 8:30-5:30, Wed 9:30-5:30, Sat 8:30-12:30 o Babies seen by [facility] providers o Accepting Medicaid o Only accepting infants of first-time parents or siblings of current patients o Hospital discharge coordinator will make follow-up appointment  Baltazar Najjar o [address] [place], [place]  [address] o [phone]   Fax - [phone]  [facility] o [address], [place], [place]  [address] o Phone - [phone]   Fax - [place] o [address], [place], [place], [place]  [address] o [phone]  [place] [phone])  [facility] Reginal Lutes, MD; Jacklynn Ganong, MD; Torrie Mayers, MD; MD; Rosana Hoes, MD; Servando Salina, MD; Rose Fillers, MD; Rex Kras, MD; Corinna Capra, MD; Volney American, MD; Trilby Drummer, MD; Janann Colonel, MD; Jimmye [place], [address] [place], [place], [place] [address] o [phone] o Mon-Fri 8:30-5:00 (extended evenings Mon-Thur as needed), Sat-Sun 10:00-1:00 o Providers come to see babies at [place] Medicaid for families of first-time babies and families with all children in the household age 46 and under. Must register with office prior to making appointment (M-F only).  [place], NP; Tomi Bamberger, MD; Redmond School, MD; [place], [address] [place], [place] [address] o [phone] o Mon-Fri 8:00-5:00 o Babies seen by providers at [facility] o Does NOT accept Medicaid/Commercial Insurance Only  [facility] [place] ([facility])  Marnee Guarneri, MD; Drema Dallas, MD; Montine Circle, MD; Vilma Prader, MD; Vanita Panda, MD; Alfonso Ramus, MD; Ruthann Cancer, MD; Roxanne Mins, MD; Rosalva Ferron, MD; Polly Cobia, MD o [address] [place], [place] [address] o [phone] o Mon-Fri 8:30-5:30, Sat (Oct.-Mar.) 9:00-1:00 o Babies seen by providers  at [place] [phone])  [facility] Peers, MD; Suzan Slick, MD o [place], [place], [place] [address] o [phone] o Mon-Fri 8:30-5:00, Sat 8:30-12:00 o Providers come to see babies at [facility] o Does NOT accept Medicaid  [place] at [place], [place]; Mannie Stabile, MD; [place], [place]; Nancy Fetter, MD; Moreen [place], [place], [place], [place] [address] o [phone] o Mon-Fri 8:00-5:00 o Babies seen by providers at [facility] o Does NOT accept Medicaid o Only accepting babies of parents who are  patients o Please call early in hospitalization for appointment (limited availability)  [facility] Blanca Friend, MD; Sharlene Motts, MD; Rod Can, MD; Warner Mccreedy, NP; Sabra Heck, MD; Ermalinda Memos, MD; Sharlett Iles, NP; Aurther Loft, MD; Jerrye Beavers, MD; Marcello Moores, MD; Berline Lopes, MD; Charolette Forward, MD o [address] [place] [address], [place], [place] [address] o [phone] o Mon-Fri 8:00-5:00, Sat 9:00-12:00 o Providers come to see babies at [facility] o Does NOT accept [facility] [phone])  [place] at [place] providers accepting new patients: Dayna Ramus, NP; [place], [place], [place], [place] [address] o [phone] o Mon-Fri 8:00-5:00 o Babies seen by providers at [facility] o Does NOT accept Medicaid o Only accepting babies of parents who are patients o Please call early in hospitalization for appointment (limited availability)  [facility] Oswaldo Conroy, MD; Sheran Lawless, [address] [place], [place] [address] o [phone] (press 1 to schedule appointment) o Mon-Fri 8:00-5:00 o Providers come to see babies at [facility] o Does NOT accept [facility] Jodi Mourning, MD o [address] [place], [place] [address] o [phone] o Mon-Fri 8:30-5:00 (lunch 12:30-1:00), extended hours by appointment only Wed 5:00-6:30 o Babies seen by [place] Medicaid  [place] at Evalyn Casco, MD; [place], MD; Ethlyn Gallery, MD o [place], [place], [place] [address] o [phone] o Mon-Fri 8:00-5:00 o Babies seen by [facility] providers o Does NOT accept Medicaid  [place] at [place], MD; Yong Channel, MD; [place], [address] [place], [place] [address] o [phone] o Mon-Fri 8:00-5:00 o Babies seen by [facility] providers o Does NOT accept [facility]  [place], [place]; [place], [place]; [place], [place]; Albertina Parr, MD;  Frederic Jericho, MD; Ronney Lion, MD; Carlos Levering, NP; Jerelene Redden, NP; Tomasita Crumble, NP; Ronelle Nigh, NP; Corinna Lines, MD; Karsten Ro, MD o [address] [place], [place] [address] o [phone] o Mon-Fri 8:30-5:00, Sat 10:00-1:00 o Providers come to see babies at [facility] o Does NOT accept Medicaid o Free prenatal information session Tuesdays at 4:45pm  [place], MD; [place], [place]; [place], [place]; [place], [place]., [place] [address] o [phone] o Mon-Fri 7:30-5:30 o Babies seen by [place] o [address], [place], [place], [place]  [address] o [phone]   Fax - [phone]  [place] [phone] & [phone])  [place], [place] o [address] [place], [place] [address] o [phone] o Mon-Thur 8:00-6:00 o Providers come to see babies at [place], NP; Melford Aase, MD; [place], [place]; [place], [place] [address] [place],  [address] o [phone] o Mon-Thur 7:30-7:30, Fri 7:30-4:30 o Babies seen by [facility] providers o Accepting [facility] Nyra Jabs, MD; [place],  NP; Gertie Baron, MD o [place] [address], [place], [place] [address] o [phone] o Mon-Fri 8:30-5:00, Sat 8:30-12:00 o Providers come to see babies at [facility] o Accepting Medicaid o Must have Meet & Greet appointment at office prior to delivery  [place] ([place]) Jodene Nam, MD; Juleen China, MD; Clydene Laming, [address], [place], [place] [address] o [phone] o Mon-Wed 8:00-6:00, Thur-Fri 8:00-5:00, Sat 9:00-12:00 o Providers come to see babies at [facility] o Does NOT accept Medicaid o Only accepting siblings of current patients  [facility]  o [address] [place],  [place], [place]  [address] o [phone]   Fax - [phone]  [place] at [facility] o [phone] [address], [place], [place]  [address] o [phone]   Fax - [phone] & [phone])  Therapist, music at [place], [place]; [place], [address] [place], [place] [address] o [phone] o Mon-Fri 7:00-5:00 o Babies seen by [facility] providers o Does NOT accept Medicaid  [place], MD; [place], [place]; [place], [address], [place], [place] [address] o [phone] o Mon-Fri 8:00-5:00 o Babies seen by [facility] providers o Accepting Medicaid  [place], MD; [place], [place]; [place], NP; [place], [place], [place], [place] [address] o [phone] o Mon-Fri 8:00-5:00 o Babies seen by providers at [place] [phone])  [facility] at [place], [place] o [address] [place], [place] [address] o [phone] o Mon-Fri 8:00-5:00 o Babies seen by [facility] providers o Does NOT accept Medicaid o Limited availability, please call early in hospitalization to schedule follow-up  [facility] Leilani Merl, [place]; Maisie Fus, MD; Charlesetta Garibaldi, MD; [place], [place]; Jeannine Kitten, MD; [place], [facility] [address], [place], [place] [address] o [phone] o Mon-Fri 8:30-5:00, Sat 9:00-12:00 o Babies seen by providers at [place] Medicaid o Please register online then schedule online or call office o [email or web address] ([place] [facility] [place]) Kristian Covey, NP; Dwyane Dee, MD; Leonidas Romberg, PA o [address], [place], [place] [address] o [phone] o Mon-Fri 8:00-5:00 o Babies seen by providers at [place] Medicaid  [place] ([facility] [place]) Dairl Ponder, MD; Rayvon Char, NP; Melina Modena, MD o [address] [place], [place], [place] [address] o [phone] o Mon-Fri 8:00-5:30, Sat&Sun by appointment (phones open at 8:30) o Babies seen by [facility] providers o Accepting Medicaid o Must be a first-time baby or sibling of current patient  [place], [address], [place], [place]  [address] o [phone]   Fax - [phone]  [place] [phone] & [phone])  [place], [place]; [place], [place]; [place], MD; [place], [place]; Harrell Lark, MD o [address] [place],  [address] o [phone] o Mon-Thur 8:00-7:00, Fri 8:00-5:00, Sat 8:00-12:00, Sun 9:00-12:00 o Babies seen by [facility] providers o Accepting Medicaid  [facility] at [facility], MD; Ruthann Cancer, MD; [facility], MD o [address], [place],  [address] o [phone] o Mon-Thur 8:00-5:00 o  Babies seen by providers at [place] Medicaid  [facility] [place] at [facility], MD; [place], MD; Amedeo Plenty, MD; Bobby Rumpf, MD; [place], MD; Lavonia Drafts, MD; Ruthann Cancer, MD; Selinda Eon, MD; Audie Box, MD; Jim Like, MD; Christie Nottingham, MD; Hubbard Hartshorn, MD; Modena Nunnery, MD o [address] [place], [place] [address] o [phone] o Mon-Fri 8:00-5:30, Sat (Oct.-Mar.) 9:00-1:00 o Babies seen by providers at [facility], [facility], [facility] o Accepting Medicaid o Must fill out new patient packet, available online at [email or web address]  [place] ([facility]) Barnabas Lister, NP; Kenton Kingfisher, NP; Claiborne Billings, NP; Rolla Plate, MD; [place], [place]; Carola Rhine, MD; Tyron Russell, MD; Delia Chimes, NP o [address], [place], [place] [address] o [phone] o Mon-Thur 8:00-5:30, Fri 8:00-5:00 o Babies seen by providers  at [facility] [phone])  [place], [place]; [place], MD; [place], MD; [place], [place] o [address] [place], [place] [address] o [phone] o Mon-Fri 8:00-5:00 o Babies seen by providers at [place] [phone])  [place] at [place], [place]; Olen Pel, MD; [place], [place], [place], [place] [address] o [phone] o Mon-Fri 8:00-5:00 o Babies seen by providers at [facility] o Does NOT accept Medicaid o Limited appointment availability, please call early in hospitalization   [place] at [place], [place]; [place], [address] [place], [place] [address] o [phone] o Mon-Fri 8:00-5:00 o Babies seen by [facility] providers o Does NOT accept [facility] - [facility] Su [place], MD; Guy Sandifer, MD; [place], [place]; [place], [place], [place], [place] [address] o [phone] o Mon-Fri 8:00-5:00 o After hours clinic [facility] [place], [place] [address]) [phone] Mon-Fri 5:00-8:00, Sat 12:00-6:00, Sun 10:00-4:00 o Babies seen by [facility] providers o Accepting Medicaid  [place] at [facility] o [address] [place], [place]  [address] o [phone]   Fax - [phone]  [place] [phone])  [place] at [place], MD o [address] [place] [address] [place], [place], [place] [address] o [phone] o Mon-Fri 8:00-5:00 o Babies seen by [facility] providers o Does NOT accept Medicaid  [place] ([place] at [place], MD o [address] [place] [address], [place], [place] [address] o [phone] o Mon-Thur 8:00-7:00, Fri 8:00-5:00, Sat 8:00-12:00 o Babies seen by providers at [facility] o Accepting Medicaid - but does not have vaccinations in office (must be  received elsewhere) o Limited availability, please call early in hospitalization  [place] [phone])  [place], [place] [address], [place] [address] o [phone]  Fax [phone]

## 2019-04-12 NOTE — Progress Notes (Signed)
   PRENATAL VISIT NOTE  Subjective:  Kathy Guerrero is a 28 y.o. G3P0020 at [redacted]w[redacted]d being seen today for ongoing prenatal care.  She is currently monitored for the following issues for this low-risk pregnancy and has Supervision of low-risk pregnancy; Vitamin D deficiency; and History of anxiety on their problem list.  Patient reports no complaints.  Contractions: Not present. Vag. Bleeding: None.  Movement: Present. Denies leaking of fluid.   The following portions of the patient's history were reviewed and updated as appropriate: allergies, current medications, past family history, past medical history, past social history, past surgical history and problem list.   Objective:   Vitals:   04/12/19 0845  BP: 117/68  Pulse: 81  Temp: 97.7 F (36.5 C)  Weight: 167 lb 3.2 oz (75.8 kg)    Fetal Status: Fetal Heart Rate (bpm): 156 Fundal Height: 29 cm Movement: Present     General:  Alert, oriented and cooperative. Patient is in no acute distress.  Skin: Skin is warm and dry. No rash noted.   Cardiovascular: Normal heart rate noted  Respiratory: Normal respiratory effort, no problems with respiration noted  Abdomen: Soft, gravid, appropriate for gestational age.  Pain/Pressure: Absent     Pelvic: Cervical exam deferred        Extremities: Normal range of motion.  Edema: None  Mental Status: Normal mood and affect. Normal behavior. Normal judgment and thought content.   Assessment and Plan:  Pregnancy: [id number] at [redacted]w[redacted]d 1. Encounter for supervision of low-risk pregnancy in third trimester - CBC, HIV, RPR and 2 hour GTT today  - Tdap vaccine greater than or equal to 7yo IM  2. Vitamin D deficiency - Vitamin D (25 hydroxy)  Preterm labor symptoms and general obstetric precautions including but not limited to vaginal bleeding, contractions, leaking of fluid and fetal movement were reviewed in detail with the patient. Please refer to After Visit Summary for other counseling  recommendations.   Return in about 4 weeks (around 05/10/2019) for LOB, Virtual.  Kerry Hough, PA-C

## 2019-04-13 LAB — CBC
Hematocrit: 33.7 % — ABNORMAL LOW (ref 34.0–46.6)
Hemoglobin: 11.3 g/dL (ref 11.1–15.9)
MCH: 31 pg (ref 26.6–33.0)
MCHC: 33.5 g/dL (ref 31.5–35.7)
MCV: 93 fL (ref 79–97)
Platelets: 310 10*3/uL (ref 150–450)
RBC: 3.64 x10E6/uL — ABNORMAL LOW (ref 3.77–5.28)
RDW: 11.8 % (ref 11.7–15.4)
WBC: 8.8 10*3/uL (ref 3.4–10.8)

## 2019-04-13 LAB — HIV ANTIBODY (ROUTINE TESTING W REFLEX): HIV Screen 4th Generation wRfx: NONREACTIVE

## 2019-04-13 LAB — GLUCOSE TOLERANCE, 2 HOURS W/ 1HR
Glucose, 1 hour: 157 mg/dL (ref 65–179)
Glucose, 2 hour: 139 mg/dL (ref 65–152)
Glucose, Fasting: 83 mg/dL (ref 65–91)

## 2019-04-13 LAB — VITAMIN D 25 HYDROXY (VIT D DEFICIENCY, FRACTURES): Vit D, 25-Hydroxy: 35.1 ng/mL (ref 30.0–100.0)

## 2019-04-13 LAB — RPR: RPR Ser Ql: NONREACTIVE

## 2019-04-17 DIAGNOSIS — O0990 Supervision of high risk pregnancy, unspecified, unspecified trimester: Secondary | ICD-10-CM | POA: Diagnosis not present

## 2019-05-10 ENCOUNTER — Other Ambulatory Visit: Payer: Self-pay

## 2019-05-10 ENCOUNTER — Telehealth (INDEPENDENT_AMBULATORY_CARE_PROVIDER_SITE_OTHER): Payer: Medicaid Other | Admitting: Medical

## 2019-05-10 VITALS — BP 127/89 | HR 100

## 2019-05-10 DIAGNOSIS — E559 Vitamin D deficiency, unspecified: Secondary | ICD-10-CM

## 2019-05-10 DIAGNOSIS — Z3A32 32 weeks gestation of pregnancy: Secondary | ICD-10-CM

## 2019-05-10 DIAGNOSIS — Z3493 Encounter for supervision of normal pregnancy, unspecified, third trimester: Secondary | ICD-10-CM

## 2019-05-10 NOTE — Patient Instructions (Addendum)
Braxton Hicks Contractions Contractions of the uterus can occur throughout pregnancy, but they are not always a sign that you are in labor. You may have practice contractions called Braxton Hicks contractions. These false labor contractions are sometimes confused with true labor. What are Braxton Hicks contractions? Braxton Hicks contractions are tightening movements that occur in the muscles of the uterus before labor. Unlike true labor contractions, these contractions do not result in opening (dilation) and thinning of the cervix. Toward the end of pregnancy (32-34 weeks), Braxton Hicks contractions can happen more often and may become stronger. These contractions are sometimes difficult to tell apart from true labor because they can be very uncomfortable. You should not feel embarrassed if you go to the hospital with false labor. Sometimes, the only way to tell if you are in true labor is for your health care provider to look for changes in the cervix. The health care provider will do a physical exam and may monitor your contractions. If you are not in true labor, the exam should show that your cervix is not dilating and your water has not broken. If there are no other health problems associated with your pregnancy, it is completely safe for you to be sent home with false labor. You may continue to have Braxton Hicks contractions until you go into true labor. How to tell the difference between true labor and false labor True labor  Contractions last 30-70 seconds.  Contractions become very regular.  Discomfort is usually felt in the top of the uterus, and it spreads to the lower abdomen and low back.  Contractions do not go away with walking.  Contractions usually become more intense and increase in frequency.  The cervix dilates and gets thinner. False labor  Contractions are usually shorter and not as strong as true labor contractions.  Contractions are usually irregular.  Contractions  are often felt in the front of the lower abdomen and in the groin.  Contractions may go away when you walk around or change positions while lying down.  Contractions get weaker and are shorter-lasting as time goes on.  The cervix usually does not dilate or become thin. Follow these instructions at home:   Take over-the-counter and prescription medicines only as told by your health care provider.  Keep up with your usual exercises and follow other instructions from your health care provider.  Eat and drink lightly if you think you are going into labor.  If Braxton Hicks contractions are making you uncomfortable: ? Change your position from lying down or resting to walking, or change from walking to resting. ? Sit and rest in a tub of warm water. ? Drink enough fluid to keep your urine pale yellow. Dehydration may cause these contractions. ? Do slow and deep breathing several times an hour.  Keep all follow-up prenatal visits as told by your health care provider. This is important. Contact a health care provider if:  You have a fever.  You have continuous pain in your abdomen. Get help right away if:  Your contractions become stronger, more regular, and closer together.  You have fluid leaking or gushing from your vagina.  You pass blood-tinged mucus (bloody show).  You have bleeding from your vagina.  You have low back pain that you never had before.  You feel your baby's head pushing down and causing pelvic pressure.  Your baby is not moving inside you as much as it used to. Summary  Contractions that occur before labor are   called Braxton Hicks contractions, false labor, or practice contractions.  Braxton Hicks contractions are usually shorter, weaker, farther apart, and less regular than true labor contractions. True labor contractions usually become progressively stronger and regular, and they become more frequent.  Manage discomfort from [facility] contractions  by changing position, resting in a warm bath, drinking plenty of water, or practicing deep breathing. This information is not intended to replace advice given to you by your health care provider. Make sure you discuss any questions you have with your health care provider. Document Released: 12/24/2016 Document Revised: 07/23/2017 Document Reviewed: 12/24/2016 Elsevier Patient Education  2020 [place]. Fetal Movement Counts Patient Name: ________________________________________________ Patient Due Date: ____________________ What is a fetal movement count?  A fetal movement count is the number of times that you feel your baby move during a certain amount of time. This may also be called a fetal kick count. A fetal movement count is recommended for every pregnant woman. You may be asked to start counting fetal movements as early as week 28 of your pregnancy. Pay attention to when your baby is most active. You may notice your baby's sleep and wake cycles. You may also notice things that make your baby move more. You should do a fetal movement count:  When your baby is normally most active.  At the same time each day. A good time to count movements is while you are resting, after having something to eat and drink. How do I count fetal movements? 1. Find a quiet, comfortable area. Sit, or lie down on your side. 2. Write down the date, the start time and stop time, and the number of movements that you felt between those two times. Take this information with you to your health care visits. 3. For 2 hours, count kicks, flutters, swishes, rolls, and jabs. You should feel at least 10 movements during 2 hours. 4. You may stop counting after you have felt 10 movements. 5. If you do not feel 10 movements in 2 hours, have something to eat and drink. Then, keep resting and counting for 1 hour. If you feel at least 4 movements during that hour, you may stop counting. Contact a health care provider if:   You feel fewer than 4 movements in 2 hours.  Your baby is not moving like he or she usually does. Date: ____________ Start time: ____________ Stop time: ____________ Movements: ____________ Date: ____________ Start time: ____________ Stop time: ____________ Movements: ____________ Date: ____________ Start time: ____________ Stop time: ____________ Movements: ____________ Date: ____________ Start time: ____________ Stop time: ____________ Movements: ____________ Date: ____________ Start time: ____________ Stop time: ____________ Movements: ____________ Date: ____________ Start time: ____________ Stop time: ____________ Movements: ____________ Date: ____________ Start time: ____________ Stop time: ____________ Movements: ____________ Date: ____________ Start time: ____________ Stop time: ____________ Movements: ____________ Date: ____________ Start time: ____________ Stop time: ____________ Movements: ____________ This information is not intended to replace advice given to you by your health care provider. Make sure you discuss any questions you have with your health care provider. Document Released: 09/09/2006 Document Revised: 08/30/2018 Document Reviewed: 09/19/2015 Elsevier Patient Education  2020 [place] for Gastroesophageal Reflux Disease, Adult When you have gastroesophageal reflux disease (GERD), the foods you eat and your eating habits are very important. Choosing the right foods can help ease your discomfort. Think about working with a nutrition specialist (dietitian) to help you make good choices. What are tips for following this plan?  Meals  Choose healthy  foods that are low in fat, such as fruits, vegetables, whole grains, low-fat dairy products, and lean meat, fish, and poultry.  Eat small meals often instead of 3 large meals a day. Eat your meals slowly, and in a place where you are relaxed. Avoid bending over or lying down until 2-3 hours after eating.   Avoid eating meals 2-3 hours before bed.  Avoid drinking a lot of liquid with meals.  Cook foods using methods other than frying. Bake, grill, or broil food instead.  Avoid or limit: ? Chocolate. ? Peppermint or spearmint. ? Alcohol. ? Pepper. ? Black and decaffeinated coffee. ? Black and decaffeinated tea. ? Bubbly (carbonated) soft drinks. ? Caffeinated energy drinks and soft drinks.  Limit high-fat foods such as: ? Fatty meat or fried foods. ? Whole milk, cream, butter, or ice cream. ? Nuts and nut butters. ? Pastries, donuts, and sweets made with butter or shortening.  Avoid foods that cause symptoms. These foods may be different for everyone. Common foods that cause symptoms include: ? Tomatoes. ? Oranges, lemons, and limes. ? Peppers. ? Spicy food. ? Onions and garlic. ? Vinegar. Lifestyle  Maintain a healthy weight. Ask your doctor what weight is healthy for you. If you need to lose weight, work with your doctor to do so safely.  Exercise for at least 30 minutes for 5 or more days each week, or as told by your doctor.  Wear loose-fitting clothes.  Do not smoke. If you need help quitting, ask your doctor.  Sleep with the head of your bed higher than your feet. Use a wedge under the mattress or blocks under the bed frame to raise the head of the bed. Summary  When you have gastroesophageal reflux disease (GERD), food and lifestyle choices are very important in easing your symptoms.  Eat small meals often instead of 3 large meals a day. Eat your meals slowly, and in a place where you are relaxed.  Limit high-fat foods such as fatty meat or fried foods.  Avoid bending over or lying down until 2-3 hours after eating.  Avoid peppermint and spearmint, caffeine, alcohol, and chocolate. This information is not intended to replace advice given to you by your health care provider. Make sure you discuss any questions you have with your health care provider. Document  Released: 02/09/2012 Document Revised: 12/01/2018 Document Reviewed: 09/15/2016 Elsevier Patient Education  2020 [employer]  Preeclampsia and Eclampsia Preeclampsia is a serious condition that may develop during pregnancy. This condition causes high blood pressure and increased protein in your urine along with other symptoms, such as headaches and vision changes. These symptoms may develop as the condition gets worse. Preeclampsia may occur at 20 weeks of pregnancy or later. Diagnosing and treating preeclampsia early is very important. If not treated early, it can cause serious problems for you and your baby. One problem it can lead to is eclampsia. Eclampsia is a condition that causes muscle jerking or shaking (convulsions or seizures) and other serious problems for the mother. During pregnancy, delivering your baby may be the best treatment for preeclampsia or eclampsia. For most women, preeclampsia and eclampsia symptoms go away after giving birth. In rare cases, a woman may develop preeclampsia after giving birth (postpartum preeclampsia). This usually occurs within 48 hours after childbirth but may occur up to 6 weeks after giving birth. What are the causes? The cause of preeclampsia is not known. What increases the risk? The following risk factors make you more likely to  develop preeclampsia:  Being pregnant for the first time.  Having had preeclampsia during a past pregnancy.  Having a family history of preeclampsia.  Having high blood pressure.  Being pregnant with more than one baby.  Being 5235 or older.  Being African-American.  Having kidney disease or diabetes.  Having medical conditions such as lupus or blood diseases.  Being very overweight (obese). What are the signs or symptoms? The most common symptoms are:  Severe headaches.  Vision problems, such as blurred or double vision.  Abdominal pain, especially upper abdominal pain. Other symptoms that may develop  as the condition gets worse include:  Sudden weight gain.  Sudden swelling of the hands, face, legs, and feet.  Severe nausea and vomiting.  Numbness in the face, arms, legs, and feet.  Dizziness.  Urinating less than usual.  Slurred speech.  Convulsions or seizures. How is this diagnosed? There are no screening tests for preeclampsia. Your health care provider will ask you about symptoms and check for signs of preeclampsia during your prenatal visits. You may also have tests that include:  Checking your blood pressure.  Urine tests to check for protein. Your health care provider will check for this at every prenatal visit.  Blood tests.  Monitoring your baby's heart rate.  Ultrasound. How is this treated? You and your health care provider will determine the treatment approach that is best for you. Treatment may include:  Having more frequent prenatal exams to check for signs of preeclampsia, if you have an increased risk for preeclampsia.  Medicine to lower your blood pressure.  Staying in the hospital, if your condition is severe. There, treatment will focus on controlling your blood pressure and the amount of fluids in your body (fluid retention).  Taking medicine (magnesium sulfate) to prevent seizures. This may be given as an injection or through an IV.  Taking a low-dose aspirin during your pregnancy.  Delivering your baby early. You may have your labor started with medicine (induced), or you may have a cesarean delivery. Follow these instructions at home: Eating and drinking   Drink enough fluid to keep your urine pale yellow.  Avoid caffeine. Lifestyle  Do not use any products that contain nicotine or tobacco, such as cigarettes and e-cigarettes. If you need help quitting, ask your health care provider.  Do not use alcohol or drugs.  Avoid stress as much as possible. Rest and get plenty of sleep. General instructions  Take over-the-counter and  prescription medicines only as told by your health care provider.  When lying down, lie on your left side. This keeps pressure off your major blood vessels.  When sitting or lying down, raise (elevate) your feet. Try putting some pillows underneath your lower legs.  Exercise regularly. Ask your health care provider what kinds of exercise are best for you.  Keep all follow-up and prenatal visits as told by your health care provider. This is important. How is this prevented? There is no known way of preventing preeclampsia or eclampsia from developing. However, to lower your risk of complications and detect problems early:  Get regular prenatal care. Your health care provider may be able to diagnose and treat the condition early.  Maintain a healthy weight. Ask your health care provider for help managing weight gain during pregnancy.  Work with your health care provider to manage any long-term (chronic) health conditions you have, such as diabetes or kidney problems.  You may have tests of your blood pressure and kidney function  after giving birth.  Your health care provider may have you take low-dose aspirin during your next pregnancy. Contact a health care provider if:  You have symptoms that your health care provider told you may require more treatment or monitoring, such as: ? Headaches. ? Nausea or vomiting. ? Abdominal pain. ? Dizziness. ? Light-headedness. Get help right away if:  You have severe: ? Abdominal pain. ? Headaches that do not get better. ? Dizziness. ? Vision problems. ? Confusion. ? Nausea or vomiting.  You have any of the following: ? A seizure. ? Sudden, rapid weight gain. ? Sudden swelling in your hands, ankles, or face. ? Trouble moving any part of your body. ? Numbness in any part of your body. ? Trouble speaking. ? Abnormal bleeding.  You faint. Summary  Preeclampsia is a serious condition that may develop during pregnancy.  This condition  causes high blood pressure and increased protein in your urine along with other symptoms, such as headaches and vision changes.  Diagnosing and treating preeclampsia early is very important. If not treated early, it can cause serious problems for you and your baby.  Get help right away if you have symptoms that your health care provider told you to watch for. This information is not intended to replace advice given to you by your health care provider. Make sure you discuss any questions you have with your health care provider. Document Released: 08/07/2000 Document Revised: 04/12/2018 Document Reviewed: 03/16/2016 Elsevier Patient Education  2020 [employer]

## 2019-05-10 NOTE — Progress Notes (Signed)
9:51a- Called pt to see if she would like to start her My Chart visit earlier but received no answer. Will retry closer to her scheduled time.    9;59a- Pt answered.

## 2019-05-10 NOTE — Progress Notes (Signed)
I connected with [name] on 05/10/19 at 10:15 AM EDT by: MyChart and verified that I am speaking with the correct person using two identifiers.  Patient is located at home and provider is located at [facility].     The purpose of this virtual visit is to provide medical care while limiting exposure to the novel coronavirus. I discussed the limitations, risks, security and privacy concerns of performing an evaluation and management service by MyChart and the availability of in person appointments. I also discussed with the patient that there may be a patient responsible charge related to this service. By engaging in this virtual visit, you consent to the provision of healthcare.  Additionally, you authorize for your insurance to be billed for the services provided during this visit.  The patient expressed understanding and agreed to proceed.  The following staff members participated in the virtual visit:  Pam Neal,CMA    PRENATAL VISIT NOTE  Subjective:  Kathy Guerrero is a 28 y.o. G3P0020 at [redacted]w[redacted]d  for phone visit for ongoing prenatal care.  She is currently monitored for the following issues for this low-risk pregnancy and has Supervision of low-risk pregnancy; Vitamin D deficiency; and History of anxiety on their problem list.  Patient reports fatigue and heartburn.  Contractions: Not present. Vag. Bleeding: None.  Movement: Present. Denies leaking of fluid.   The following portions of the patient's history were reviewed and updated as appropriate: allergies, current medications, past family history, past medical history, past social history, past surgical history and problem list.   Objective:   Vitals:   05/10/19 1001  BP: 127/89  Pulse: 100   Self-Obtained  Fetal Status:     Movement: Present     Assessment and Plan:  Pregnancy: G3P0020 at [redacted]w[redacted]d 1. Encounter for supervision of low-risk pregnancy in third trimester - Doing well - Complaint of mild heartburn  recently - Discussed GERD diet and propping up head of the bed, avoiding eating within 3 hours of bedtime, try TUMs first, if symptoms worsen can start Pepcid OTC for prevention of symptoms daily at bedtime  - Discussed pillows and positions for safe and comfortable sleep in third trimester  - Discussed flu shot, patient has had side effects of sickness and feeling unwell with flu shot in the past, will consider as it relates to pregnancy and we can ask again at next visit   2. Vitamin D deficiency - Last check 04/12/19 was normal - Patient will complete last 2 doses weekly and then not refill Rx  Preterm labor symptoms and general obstetric precautions including but not limited to vaginal bleeding, contractions, leaking of fluid and fetal movement were reviewed in detail with the patient.  Return in about 4 weeks (around 06/07/2019) for LOB, In-Person.  No future appointments.   Time spent on virtual visit: 15 minutes  Kerry Hough, PA-C

## 2019-06-07 ENCOUNTER — Other Ambulatory Visit (HOSPITAL_COMMUNITY)
Admission: RE | Admit: 2019-06-07 | Discharge: 2019-06-07 | Disposition: A | Discharge status: Home / Self Care | Payer: Medicaid Other | Source: Ambulatory Visit | Attending: Medical | Admitting: Medical

## 2019-06-07 ENCOUNTER — Ambulatory Visit (INDEPENDENT_AMBULATORY_CARE_PROVIDER_SITE_OTHER): Payer: Medicaid Other | Admitting: Medical

## 2019-06-07 ENCOUNTER — Other Ambulatory Visit: Payer: Self-pay

## 2019-06-07 ENCOUNTER — Encounter: Payer: Self-pay | Admitting: Medical

## 2019-06-07 VITALS — BP 127/77 | HR 75 | Wt 181.9 lb

## 2019-06-07 DIAGNOSIS — Z3493 Encounter for supervision of normal pregnancy, unspecified, third trimester: Secondary | ICD-10-CM | POA: Diagnosis not present

## 2019-06-07 DIAGNOSIS — Z3A36 36 weeks gestation of pregnancy: Secondary | ICD-10-CM

## 2019-06-07 NOTE — Patient Instructions (Addendum)
Fetal Movement Counts Patient Name: ________________________________________________ Patient Due Date: ____________________ What is a fetal movement count?  A fetal movement count is the number of times that you feel your baby move during a certain amount of time. This may also be called a fetal kick count. A fetal movement count is recommended for every pregnant woman. You may be asked to start counting fetal movements as early as week 28 of your pregnancy. Pay attention to when your baby is most active. You may notice your baby's sleep and wake cycles. You may also notice things that make your baby move more. You should do a fetal movement count:  When your baby is normally most active.  At the same time each day. A good time to count movements is while you are resting, after having something to eat and drink. How do I count fetal movements? 1. Find a quiet, comfortable area. Sit, or lie down on your side. 2. Write down the date, the start time and stop time, and the number of movements that you felt between those two times. Take this information with you to your health care visits. 3. For 2 hours, count kicks, flutters, swishes, rolls, and jabs. You should feel at least 10 movements during 2 hours. 4. You may stop counting after you have felt 10 movements. 5. If you do not feel 10 movements in 2 hours, have something to eat and drink. Then, keep resting and counting for 1 hour. If you feel at least 4 movements during that hour, you may stop counting. Contact a health care provider if:  You feel fewer than 4 movements in 2 hours.  Your baby is not moving like he or she usually does. Date: ____________ Start time: ____________ Stop time: ____________ Movements: ____________ Date: ____________ Start time: ____________ Stop time: ____________ Movements: ____________ Date: ____________ Start time: ____________ Stop time: ____________ Movements: ____________ Date: ____________ Start time:  ____________ Stop time: ____________ Movements: ____________ Date: ____________ Start time: ____________ Stop time: ____________ Movements: ____________ Date: ____________ Start time: ____________ Stop time: ____________ Movements: ____________ Date: ____________ Start time: ____________ Stop time: ____________ Movements: ____________ Date: ____________ Start time: ____________ Stop time: ____________ Movements: ____________ Date: ____________ Start time: ____________ Stop time: ____________ Movements: ____________ This information is not intended to replace advice given to you by your health care provider. Make sure you discuss any questions you have with your health care provider. Document Released: 09/09/2006 Document Revised: 08/30/2018 Document Reviewed: 09/19/2015 Elsevier Patient Education  2020 Else[employer] Braxton Hicks Contractions Contractions of the uterus can occur throughout pregnancy, but they are not always a sign that you are in labor. You may have practice contractions called Braxton Hicks contractions. These false labor contractions are sometimes confused with true labor. What are Braxton Hicks contractions? Braxton Hicks contractions are tightening movements that occur in the muscles of the uterus before labor. Unlike true labor contractions, these contractions do not result in opening (dilation) and thinning of the cervix. Toward the end of pregnancy (32-34 weeks), Braxton Hicks contractions can happen more often and may become stronger. These contractions are sometimes difficult to tell apart from true labor because they can be very uncomfortable. You should not feel embarrassed if you go to the hospital with false labor. Sometimes, the only way to tell if you are in true labor is for your health care provider to look for changes in the cervix. The health care provider will do a physical exam and may monitor your contractions. If you   are not in true labor, the exam should show  that your cervix is not dilating and your water has not broken. If there are no other health problems associated with your pregnancy, it is completely safe for you to be sent home with false labor. You may continue to have Braxton Hicks contractions until you go into true labor. How to tell the difference between true labor and false labor True labor  Contractions last 30-70 seconds.  Contractions become very regular.  Discomfort is usually felt in the top of the uterus, and it spreads to the lower abdomen and low back.  Contractions do not go away with walking.  Contractions usually become more intense and increase in frequency.  The cervix dilates and gets thinner. False labor  Contractions are usually shorter and not as strong as true labor contractions.  Contractions are usually irregular.  Contractions are often felt in the front of the lower abdomen and in the groin.  Contractions may go away when you walk around or change positions while lying down.  Contractions get weaker and are shorter-lasting as time goes on.  The cervix usually does not dilate or become thin. Follow these instructions at home:   Take over-the-counter and prescription medicines only as told by your health care provider.  Keep up with your usual exercises and follow other instructions from your health care provider.  Eat and drink lightly if you think you are going into labor.  If Braxton Hicks contractions are making you uncomfortable: ? Change your position from lying down or resting to walking, or change from walking to resting. ? Sit and rest in a tub of warm water. ? Drink enough fluid to keep your urine pale yellow. Dehydration may cause these contractions. ? Do slow and deep breathing several times an hour.  Keep all follow-up prenatal visits as told by your health care provider. This is important. Contact a health care provider if:  You have a fever.  You have continuous pain in  your abdomen. Get help right away if:  Your contractions become stronger, more regular, and closer together.  You have fluid leaking or gushing from your vagina.  You pass blood-tinged mucus (bloody show).  You have bleeding from your vagina.  You have low back pain that you never had before.  You feel your baby's head pushing down and causing pelvic pressure.  Your baby is not moving inside you as much as it used to. Summary  Contractions that occur before labor are called Braxton Hicks contractions, false labor, or practice contractions.  Braxton Hicks contractions are usually shorter, weaker, farther apart, and less regular than true labor contractions. True labor contractions usually become progressively stronger and regular, and they become more frequent.  Manage discomfort from [facility] contractions by changing position, resting in a warm bath, drinking plenty of water, or practicing deep breathing. This information is not intended to replace advice given to you by your health care provider. Make sure you discuss any questions you have with your health care provider. Document Released: 12/24/2016 Document Revised: 07/23/2017 Document Reviewed: 12/24/2016 Elsevier Patient Education  2020 [email or web address] - virtual tour of the new hospital

## 2019-06-07 NOTE — Progress Notes (Signed)
   PRENATAL VISIT NOTE  Subjective:  Kathy Guerrero is a 28 y.o. G3P0020 at [redacted]w[redacted]d being seen today for ongoing prenatal care.  She is currently monitored for the following issues for this low-risk pregnancy and has Supervision of low-risk pregnancy; Vitamin D deficiency; and History of anxiety on their problem list.  Patient reports no complaints.  Contractions: Not present. Vag. Bleeding: None.  Movement: Present. Denies leaking of fluid.   The following portions of the patient's history were reviewed and updated as appropriate: allergies, current medications, past family history, past medical history, past social history, past surgical history and problem list.   Objective:   Vitals:   06/07/19 1115  BP: 127/77  Pulse: 75  Weight: 181 lb 14.4 oz (82.5 kg)    Fetal Status: Fetal Heart Rate (bpm): 139 Fundal Height: 36 cm Movement: Present  Presentation: Vertex  General:  Alert, oriented and cooperative. Patient is in no acute distress.  Skin: Skin is warm and dry. No rash noted.   Cardiovascular: Normal heart rate noted  Respiratory: Normal respiratory effort, no problems with respiration noted  Abdomen: Soft, gravid, appropriate for gestational age.  Pain/Pressure: Present     Pelvic: Cervical exam performed Dilation: Closed Effacement (%): 50 Station: -3  Extremities: Normal range of motion.  Edema: None  Mental Status: Normal mood and affect. Normal behavior. Normal judgment and thought content.   Assessment and Plan:  Pregnancy: [id number] at [redacted]w[redacted]d 1. Encounter for supervision of low-risk pregnancy in third trimester - Doing well, no complaints  - GC/Chlamydia probe amp ([place])not at [facility] - Culture, beta strep (group b only) - Flu Vaccine QUAD 36+ mos IM  Preterm labor symptoms and general obstetric precautions including but not limited to vaginal bleeding, contractions, leaking of fluid and fetal movement were reviewed in detail with the patient. Please refer to  After Visit Summary for other counseling recommendations.   Return in about 2 weeks (around 06/21/2019) for LOB, In-Person.  Future Appointments  Date Time Provider [place]  06/14/2019 10:15 AM Danielle Rankin [facility] [place]  06/21/2019 10:15 AM Carolyne Fiscal, Gerrie Nordmann, NP [facility]  06/28/2019 10:15 AM Darlina Rumpf, CNM [facility]  07/06/2019 10:15 AM Rasch, Artist Pais, NP [facility]  07/06/2019 11:15 AM [facility] NST [facility]    Kerry Hough, PA-C

## 2019-06-11 LAB — CULTURE, BETA STREP (GROUP B ONLY): Strep Gp B Culture: NEGATIVE

## 2019-06-12 LAB — GC/CHLAMYDIA PROBE AMP (~~LOC~~) NOT AT ARMC
Chlamydia: NEGATIVE
Comment: NEGATIVE
Comment: NORMAL
Neisseria Gonorrhea: NEGATIVE

## 2019-06-14 ENCOUNTER — Telehealth: Payer: Medicaid Other | Admitting: Medical

## 2019-06-20 NOTE — Progress Notes (Deleted)
I connected with@ on 06/20/19 at 10:15 AM EDT by: MyChart Video*** and verified that I am speaking with the correct person using two identifiers.  Patient is located at home and provider is located at [facility] office.     The purpose of this virtual visit is to provide medical care while limiting exposure to the novel coronavirus. I discussed the limitations, risks, security and privacy concerns of performing an evaluation and management service by MyChart Video*** and the availability of in person appointments. I also discussed with the patient that there may be a patient responsible charge related to this service. By engaging in this virtual visit, you consent to the provision of healthcare.  Additionally, you authorize for your insurance to be billed for the services provided during this visit.  The patient expressed understanding and agreed to proceed.  The following staff members participated in the virtual visit:  Vernice Jefferson, NP***    PRENATAL VISIT NOTE  Subjective:  Kathy Guerrero is a 28 y.o. G3P0020 at [redacted]w[redacted]d  for phone visit for ongoing prenatal care.  She is currently monitored for the following issues for this low-risk pregnancy and has Supervision of low-risk pregnancy; Vitamin D deficiency; and History of anxiety on their problem list.  Patient reports {[id number]}.   .  .   . Denies leaking of fluid.   The following portions of the patient's history were reviewed and updated as appropriate: allergies, current medications, past family history, past medical history, past social history, past surgical history and problem list.   Objective:  There were no vitals filed for this visit. Self-Obtained  Fetal Status:           Assessment and Plan:  Pregnancy: G3P0020 at [redacted]w[redacted]d  1. Encounter for supervision of low-risk pregnancy in third trimester ***  2. Vitamin D deficiency ***  Term labor symptoms and general obstetric precautions including but not limited to vaginal  bleeding, contractions, leaking of fluid and fetal movement were reviewed in detail with the patient.  No follow-ups on file.  Future Appointments  Date Time Provider [place]  06/21/2019 10:15 AM Carolyne Fiscal, Gerrie Nordmann, NP [facility]  06/28/2019 10:15 AM Darlina Rumpf, CNM [facility]  07/06/2019 10:15 AM Rasch, Artist Pais, NP [facility]  07/06/2019 11:15 AM [facility] NST [facility]     Time spent on virtual visit: *** minutes  Clarisa Fling, NP

## 2019-06-21 ENCOUNTER — Other Ambulatory Visit: Payer: Self-pay

## 2019-06-21 ENCOUNTER — Telehealth: Payer: Medicaid Other | Admitting: Women's Health

## 2019-06-22 ENCOUNTER — Other Ambulatory Visit: Payer: Self-pay

## 2019-06-22 ENCOUNTER — Telehealth (INDEPENDENT_AMBULATORY_CARE_PROVIDER_SITE_OTHER): Payer: Medicaid Other | Admitting: Obstetrics & Gynecology

## 2019-06-22 DIAGNOSIS — Z3493 Encounter for supervision of normal pregnancy, unspecified, third trimester: Secondary | ICD-10-CM

## 2019-06-22 DIAGNOSIS — O26893 Other specified pregnancy related conditions, third trimester: Secondary | ICD-10-CM

## 2019-06-22 DIAGNOSIS — E559 Vitamin D deficiency, unspecified: Secondary | ICD-10-CM

## 2019-06-22 DIAGNOSIS — Z8659 Personal history of other mental and behavioral disorders: Secondary | ICD-10-CM

## 2019-06-22 DIAGNOSIS — Z3A38 38 weeks gestation of pregnancy: Secondary | ICD-10-CM

## 2019-06-22 NOTE — Progress Notes (Signed)
   TELEHEALTH OBSTETRICS PRENATAL VIRTUAL VIDEO VISIT ENCOUNTER NOTE  Provider location: Center for [facility] at [place]   I connected with Kathy Guerrero on 06/22/19 at  2:55 PM EDT by MyChart Video Encounter at home and verified that I am speaking with the correct person using two identifiers.   I discussed the limitations, risks, security and privacy concerns of performing an evaluation and management service virtually and the availability of in person appointments. I also discussed with the patient that there may be a patient responsible charge related to this service. The patient expressed understanding and agreed to proceed. Subjective:  Kathy Guerrero is a 28 y.o. G3P0020 at [redacted]w[redacted]d being seen today for ongoing prenatal care.  She is currently monitored for the following issues for this low-risk pregnancy and has Supervision of low-risk pregnancy; Vitamin D deficiency; and History of anxiety on their problem list.  Patient reports no complaints.  Contractions: Not present. Vag. Bleeding: None.  Movement: Present. Denies any leaking of fluid.   The following portions of the patient's history were reviewed and updated as appropriate: allergies, current medications, past family history, past medical history, past social history, past surgical history and problem list.   Objective:  There were no vitals filed for this visit.  BP: not taken today. BP cuff is packed away in a box for packing. Will check later today.       Fetal Status:     Movement: Present     General:  Alert, oriented and cooperative. Patient is in no acute distress.  Respiratory: Normal respiratory effort, no problems with respiration noted  Mental Status: Normal mood and affect. Normal behavior. Normal judgment and thought content.  Rest of physical exam deferred due to type of encounter  Imaging: No results found.  Assessment and Plan:  Pregnancy: G3P0020 at [redacted]w[redacted]d 1. Encounter for supervision of  low-risk pregnancy in third trimester +FM   2. History of anxiety  3. Vitamin D deficiency Stopped supplements.   Term labor symptoms and general obstetric precautions including but not limited to vaginal bleeding, contractions, leaking of fluid and fetal movement were reviewed in detail with the patient. I discussed the assessment and treatment plan with the patient. The patient was provided an opportunity to ask questions and all were answered. The patient agreed with the plan and demonstrated an understanding of the instructions. The patient was advised to call back or seek an in-person office evaluation/go to MAU at [facility] for any urgent or concerning symptoms. Please refer to After Visit Summary for other counseling recommendations.   I provided 10 minutes of face-to-face time during this encounter.  Return today (on 06/22/2019) for in person.  Future Appointments  Date Time Provider [place]  06/28/2019 10:15 AM Kieth Brightly [facility] [place]  07/06/2019 10:15 AM Rasch, Artist Pais, NP [facility]  07/06/2019 11:15 AM [facility] NST [facility]    Lavonia Drafts, MD [facility] for [facility], [place]

## 2019-06-22 NOTE — Progress Notes (Signed)
I connected with  Kathy Guerrero on 06/22/19 at 2:54 PM by telephone and verified that I am speaking with the correct person using two identifiers.   I discussed the limitations, risks, security and privacy concerns of performing an evaluation and management service by telephone and the availability of in person appointments. I also discussed with the patient that there may be a patient responsible charge related to this service. The patient expressed understanding and agreed to proceed.  Loma Sousa, RN

## 2019-06-28 ENCOUNTER — Encounter (HOSPITAL_COMMUNITY): Payer: Self-pay | Admitting: *Deleted

## 2019-06-28 ENCOUNTER — Inpatient Hospital Stay (HOSPITAL_COMMUNITY)
Admission: AD | Admit: 2019-06-28 | Discharge: 2019-06-28 | Disposition: A | Discharge status: Home / Self Care | Payer: Medicaid Other | Attending: Obstetrics & Gynecology | Admitting: Obstetrics & Gynecology

## 2019-06-28 ENCOUNTER — Other Ambulatory Visit: Payer: Self-pay

## 2019-06-28 ENCOUNTER — Telehealth: Payer: Medicaid Other | Admitting: Advanced Practice Midwife

## 2019-06-28 DIAGNOSIS — O471 False labor at or after 37 completed weeks of gestation: Secondary | ICD-10-CM | POA: Diagnosis not present

## 2019-06-28 DIAGNOSIS — Z3493 Encounter for supervision of normal pregnancy, unspecified, third trimester: Secondary | ICD-10-CM

## 2019-06-28 DIAGNOSIS — Z3A39 39 weeks gestation of pregnancy: Secondary | ICD-10-CM

## 2019-06-28 NOTE — Progress Notes (Signed)
Pt states uncomfortable contractions since 9pm. Unable to sleep last night. Breathing through.

## 2019-06-28 NOTE — MAU Provider Note (Signed)
Ms. GIARA MCGAUGHEY is a [id number] at [redacted]w[redacted]d seen in MAU for labor. RN labor check, not seen by provider. SVE by RN Dilation: 1 Effacement (%): 60 Station: -3 Presentation: Vertex Exam by:: Lurena Nida, RN  *Cervix unchanged after being rechecked 1 hour after  NST - FHR: 135 bpm / moderate variability / accels present / decels absent / TOCO: regular every 4-5 mins with UI noted  Plan:  D/C home with labor precautions Keep scheduled appt with [facility] on 07/06/2019  Laury Deep, CNM  06/28/2019 9:34 AM

## 2019-06-28 NOTE — MAU Note (Signed)
Presents with c/o ctxs every 5 minutes.  Reports bloody show/mucus plug, denies LOF.  Endorses +FM.

## 2019-06-29 ENCOUNTER — Encounter (HOSPITAL_COMMUNITY): Payer: Self-pay

## 2019-06-29 ENCOUNTER — Inpatient Hospital Stay (HOSPITAL_COMMUNITY)
Admission: AD | Admit: 2019-06-29 | Discharge: 2019-06-30 | Disposition: A | Discharge status: Home / Self Care | Payer: Medicaid Other | Source: Ambulatory Visit | Attending: Obstetrics and Gynecology | Admitting: Obstetrics and Gynecology

## 2019-06-29 ENCOUNTER — Other Ambulatory Visit: Payer: Self-pay

## 2019-06-29 DIAGNOSIS — O479 False labor, unspecified: Secondary | ICD-10-CM

## 2019-06-29 DIAGNOSIS — O163 Unspecified maternal hypertension, third trimester: Secondary | ICD-10-CM

## 2019-06-29 DIAGNOSIS — O471 False labor at or after 37 completed weeks of gestation: Secondary | ICD-10-CM | POA: Insufficient documentation

## 2019-06-29 DIAGNOSIS — Z3A40 40 weeks gestation of pregnancy: Secondary | ICD-10-CM | POA: Insufficient documentation

## 2019-06-29 NOTE — MAU Note (Signed)
Pt reports ctx that started on Tuesday. Pt reports that they have got worse today.   Denies vaginal bleeding or LOF.   Reports +FM

## 2019-06-30 DIAGNOSIS — O163 Unspecified maternal hypertension, third trimester: Secondary | ICD-10-CM

## 2019-06-30 MED ORDER — OXYCODONE-ACETAMINOPHEN 5-325 MG PO TABS: 1.0000 | ORAL_TABLET | ORAL | 0 refills | Status: DC | PRN

## 2019-06-30 MED ORDER — ZOLPIDEM TARTRATE ER 6.25 MG PO TBCR: 6.2500 mg | EXTENDED_RELEASE_TABLET | Freq: Every evening | ORAL | 0 refills | Status: DC | PRN

## 2019-07-01 ENCOUNTER — Other Ambulatory Visit: Payer: Self-pay

## 2019-07-01 ENCOUNTER — Encounter (HOSPITAL_COMMUNITY): Payer: Self-pay

## 2019-07-01 ENCOUNTER — Inpatient Hospital Stay (HOSPITAL_COMMUNITY)
Admission: AD | Admit: 2019-07-01 | Discharge: 2019-07-04 | DRG: 769 | Disposition: A | Discharge status: Home / Self Care | Payer: Medicaid Other | Attending: Obstetrics and Gynecology | Admitting: Obstetrics and Gynecology

## 2019-07-01 ENCOUNTER — Inpatient Hospital Stay (HOSPITAL_COMMUNITY): Payer: Medicaid Other | Admitting: Anesthesiology

## 2019-07-01 DIAGNOSIS — Z20828 Contact with and (suspected) exposure to other viral communicable diseases: Secondary | ICD-10-CM | POA: Diagnosis present

## 2019-07-01 DIAGNOSIS — Z3A40 40 weeks gestation of pregnancy: Secondary | ICD-10-CM | POA: Diagnosis not present

## 2019-07-01 DIAGNOSIS — O135 Gestational [pregnancy-induced] hypertension without significant proteinuria, complicating the puerperium: Principal | ICD-10-CM | POA: Diagnosis present

## 2019-07-01 DIAGNOSIS — Z87891 Personal history of nicotine dependence: Secondary | ICD-10-CM

## 2019-07-01 DIAGNOSIS — O165 Unspecified maternal hypertension, complicating the puerperium: Secondary | ICD-10-CM

## 2019-07-01 DIAGNOSIS — Z3689 Encounter for other specified antenatal screening: Secondary | ICD-10-CM | POA: Diagnosis not present

## 2019-07-01 DIAGNOSIS — O26893 Other specified pregnancy related conditions, third trimester: Secondary | ICD-10-CM | POA: Diagnosis not present

## 2019-07-01 DIAGNOSIS — Z3A39 39 weeks gestation of pregnancy: Secondary | ICD-10-CM

## 2019-07-01 DIAGNOSIS — Z3493 Encounter for supervision of normal pregnancy, unspecified, third trimester: Secondary | ICD-10-CM

## 2019-07-01 LAB — COMPREHENSIVE METABOLIC PANEL
ALT: 11 U/L (ref 0–44)
AST: 19 U/L (ref 15–41)
Albumin: 3 g/dL — ABNORMAL LOW (ref 3.5–5.0)
Alkaline Phosphatase: 117 U/L (ref 38–126)
Anion gap: 13 (ref 5–15)
BUN: 8 mg/dL (ref 6–20)
CO2: 15 mmol/L — ABNORMAL LOW (ref 22–32)
Calcium: 9.5 mg/dL (ref 8.9–10.3)
Chloride: 103 mmol/L (ref 98–111)
Creatinine, Ser: 0.69 mg/dL (ref 0.44–1.00)
GFR calc Af Amer: >60 mL/min (ref >60)
GFR calc non Af Amer: >60 mL/min (ref >60)
Glucose, Bld: 108 mg/dL — ABNORMAL HIGH (ref 70–99)
Potassium: 3.7 mmol/L (ref 3.5–5.1)
Sodium: 131 mmol/L — ABNORMAL LOW (ref 135–145)
Total Bilirubin: 0.9 mg/dL (ref 0.3–1.2)
Total Protein: 6.2 g/dL — ABNORMAL LOW (ref 6.5–8.1)

## 2019-07-01 LAB — CBC
HCT: 38.3 % (ref 36.0–46.0)
Hemoglobin: 13 g/dL (ref 12.0–15.0)
MCH: 30.7 pg (ref 26.0–34.0)
MCHC: 33.9 g/dL (ref 30.0–36.0)
MCV: 90.5 fL (ref 80.0–100.0)
Platelets: 319 10*3/uL (ref 150–400)
RBC: 4.23 MIL/uL (ref 3.87–5.11)
RDW: 12.8 % (ref 11.5–15.5)
WBC: 18.1 10*3/uL — ABNORMAL HIGH (ref 4.0–10.5)
nRBC: 0 % (ref 0.0–0.2)

## 2019-07-01 LAB — TYPE AND SCREEN
ABO/RH(D): O POS
Antibody Screen: NEGATIVE

## 2019-07-01 LAB — PROTEIN / CREATININE RATIO, URINE
Creatinine, Urine: 226.86 mg/dL
Protein Creatinine Ratio: 0.07 mg/mg{Cre} (ref 0.00–0.15)
Total Protein, Urine: 15 mg/dL

## 2019-07-01 LAB — ABO/RH: ABO/RH(D): O POS

## 2019-07-01 LAB — SARS CORONAVIRUS 2 BY RT PCR (HOSPITAL ORDER, PERFORMED IN ~~LOC~~ HOSPITAL LAB): SARS Coronavirus 2: NEGATIVE

## 2019-07-01 MED ORDER — LACTATED RINGERS IV SOLN: 500.0000 mL | INTRAVENOUS | Status: DC | PRN

## 2019-07-01 MED ORDER — OXYTOCIN BOLUS FROM INFUSION
500.0000 mL | Freq: Once | INTRAVENOUS | Status: AC
  Administered 2019-07-02: 02:00:00 500 mL via INTRAVENOUS

## 2019-07-01 MED ORDER — LABETALOL HCL 5 MG/ML IV SOLN: 40.0000 mg | INTRAVENOUS | Status: DC | PRN

## 2019-07-01 MED ORDER — OXYCODONE-ACETAMINOPHEN 5-325 MG PO TABS: 2.0000 | ORAL_TABLET | ORAL | Status: DC | PRN

## 2019-07-01 MED ORDER — SODIUM CHLORIDE (PF) 0.9 % IJ SOLN
INTRAMUSCULAR | Status: DC | PRN
  Administered 2019-07-01: 12 mL/h via EPIDURAL

## 2019-07-01 MED ORDER — LABETALOL HCL 5 MG/ML IV SOLN: 80.0000 mg | INTRAVENOUS | Status: DC | PRN

## 2019-07-01 MED ORDER — EPHEDRINE 5 MG/ML INJ: 10.0000 mg | INTRAVENOUS | Status: DC | PRN

## 2019-07-01 MED ORDER — LACTATED RINGERS IV SOLN
500.0000 mL | Freq: Once | INTRAVENOUS | Status: AC
  Administered 2019-07-01: 500 mL via INTRAVENOUS

## 2019-07-01 MED ORDER — ONDANSETRON HCL 4 MG/2ML IJ SOLN: 4.0000 mg | Freq: Four times a day (QID) | INTRAMUSCULAR | Status: DC | PRN

## 2019-07-01 MED ORDER — ACETAMINOPHEN 325 MG PO TABS: 650.0000 mg | ORAL_TABLET | ORAL | Status: DC | PRN

## 2019-07-01 MED ORDER — SOD CITRATE-CITRIC ACID 500-334 MG/5ML PO SOLN: 30.0000 mL | ORAL | Status: DC | PRN

## 2019-07-01 MED ORDER — OXYTOCIN 40 UNITS IN NORMAL SALINE INFUSION - SIMPLE MED
INTRAVENOUS | Status: AC
  Administered 2019-07-02: 500 mL via INTRAVENOUS
  Filled 2019-07-01: qty 1000

## 2019-07-01 MED ORDER — PHENYLEPHRINE 40 MCG/ML (10ML) SYRINGE FOR IV PUSH (FOR BLOOD PRESSURE SUPPORT): 80.0000 ug | PREFILLED_SYRINGE | INTRAVENOUS | Status: DC | PRN

## 2019-07-01 MED ORDER — FENTANYL-BUPIVACAINE-NACL 0.5-0.125-0.9 MG/250ML-% EP SOLN
12.0000 mL/h | EPIDURAL | Status: DC | PRN
  Filled 2019-07-01: qty 250

## 2019-07-01 MED ORDER — DIPHENHYDRAMINE HCL 50 MG/ML IJ SOLN: 12.5000 mg | INTRAMUSCULAR | Status: DC | PRN

## 2019-07-01 MED ORDER — OXYTOCIN 40 UNITS IN NORMAL SALINE INFUSION - SIMPLE MED
2.5000 [IU]/h | INTRAVENOUS | Status: DC
  Administered 2019-07-02: 2.5 [IU]/h via INTRAVENOUS

## 2019-07-01 MED ORDER — LIDOCAINE HCL (PF) 1 % IJ SOLN: 30.0000 mL | INTRAMUSCULAR | Status: DC | PRN

## 2019-07-01 MED ORDER — LIDOCAINE HCL (PF) 1 % IJ SOLN
INTRAMUSCULAR | Status: DC | PRN
  Administered 2019-07-01: 8 mL via EPIDURAL
  Administered 2019-07-01: 6 mL via EPIDURAL

## 2019-07-01 MED ORDER — LABETALOL HCL 5 MG/ML IV SOLN: 20.0000 mg | INTRAVENOUS | Status: DC | PRN

## 2019-07-01 MED ORDER — FLEET ENEMA 7-19 GM/118ML RE ENEM: 1.0000 | ENEMA | RECTAL | Status: DC | PRN

## 2019-07-01 MED ORDER — OXYCODONE-ACETAMINOPHEN 5-325 MG PO TABS: 1.0000 | ORAL_TABLET | ORAL | Status: DC | PRN

## 2019-07-01 MED ORDER — LACTATED RINGERS IV SOLN
INTRAVENOUS | Status: DC
  Administered 2019-07-01 (×2): via INTRAVENOUS

## 2019-07-01 MED ORDER — HYDRALAZINE HCL 20 MG/ML IJ SOLN: 10.0000 mg | INTRAMUSCULAR | Status: DC | PRN

## 2019-07-01 NOTE — H&P (Addendum)
Kathy Guerrero is a 28 y.o. female G3P0020 with IUP at 50w6dby 20w u/s presenting for SOL, SROM. She reports +FMs, No LOF, no VB, no blurry vision, headaches or peripheral edema, and RUQ pain.  She plans on Breast feeding. She request POP for birth control. [facility]  Dating: By 20w u/s --->  Estimated Date of Delivery: 07/02/19   Nursing Staff Provider  Office Location  [facility] Dating  20 [place] Language   English Anatomy [place] Normal  Flu Vaccine  06/07/2019 Genetic Screen  NIPS: Low risk female  AFP: Negative   TDaP vaccine   04/12/19 Hgb A1C or  GTT Early  A1C 5.0 Third trimester [id number]  Rhogam   NA   LAB RESULTS   Feeding Plan  Breast Blood Type O/Positive/-- (06/24 1018)   Contraception  POP Antibody Negative (06/24 1018)  Circumcision  n/a Rubella 5.03 (06/24 1018)  Pediatrician   CBranchRPR Non Reactive (06/24 1018)   Support Person  CLysbeth GalasHBsAg Negative (06/24 1018)   Prenatal Classes  Info given HIV Non Reactive (06/24 1018)  BTL Consent NA GBS  Negative  VBAC Consent NA Pap Normal    Hgb Electro  AA  BP Cuff  Has cuff CF Neg    SMA Neg    Waterbirth  [ ]  Class [ ]  Consent [ ]  CNM visit     Prenatal History/Complications:  Past Medical History: Past Medical History:  Diagnosis Date  . [place], none since then  . UTI (urinary tract infection)     Past Surgical History: Past Surgical History:  Procedure Laterality Date  . NO PAST SURGERIES      Obstetrical History: OB History    Gravida  3   Para      Term      Preterm      AB  2   Living        SAB      TAB  1   Ectopic      Multiple      Live Births              Social History Social History   Socioeconomic History  . Marital status: Single    Spouse name: Not on file  . Number of children: Not on file  . Years of education: Not on file  . Highest education level: Not  on file  Occupational History  . Not on file  Social Needs  . Financial resource strain: Not on file  . Food insecurity    Worry: Never true    Inability: Never true  . Transportation needs    Medical: No    Non-medical: No  Tobacco Use  . Smoking status: Former Smoker    Packs/day: 0.50    Types: Cigarettes    Quit date: 01/23/2019    Years since quitting: 0.4  . Smokeless tobacco: Never Used  Substance and Sexual Activity  . Alcohol use: Not Currently    Comment: socially not since pregnancy  . Drug use: No  . Sexual activity: Yes    Birth control/protection: None  Lifestyle  . Physical activity    Days per week: Not on file    Minutes per session: Not on file  . Stress: Not on file  Relationships  . Social [place] phone:  Not on file    Gets together: Not on file    Attends religious service: Not on file    Active member of club or organization: Not on file    Attends meetings of clubs or organizations: Not on file    Relationship status: Not on file  Other Topics Concern  . Not on file  Social History Narrative  . Not on file    Family History: Family History  Problem Relation Age of Onset  . Hypertension Father     Allergies: Allergies  Allergen Reactions  . Dilantin [Phenytoin] Rash    Medications Prior to Admission  Medication Sig Dispense Refill Last Dose  . AMBULATORY NON FORMULARY MEDICATION 1 Device by Other route once a week. Blood Pressure Cuff/ Medium Monitored Regularly at home ICD 10: O09.90 1 kit 0   . oxyCODONE-acetaminophen (PERCOCET) 5-325 MG tablet Take 1 tablet by mouth every 4 (four) hours as needed for up to 3 days for severe pain. 18 tablet 0   . Prenatal Vit-Fe Fumarate-FA (PRENATAL VITAMINS PO) Take 1 tablet by mouth daily.     Marland Kitchen zolpidem (AMBIEN CR) 6.25 MG CR tablet Take 1 tablet (6.25 mg total) by mouth at bedtime as needed for up to 7 days for sleep (1 tablet before bed). 7 tablet 0      Review of Systems    All systems reviewed and negative except as stated in HPI  There were no vitals taken for this visit. General appearance: alert, cooperative and no distress Lungs: clear to auscultation bilaterally Heart: regular rate and rhythm Abdomen: soft, non-tender; bowel sounds normal Pelvic: n/a Extremities: Homans sign is negative, no sign of DVT DTR's +2 Presentation: cephalic Fetal monitoringBaseline: 135 bpm, Variability: Good {> 6 bpm), Accelerations: Reactive and Decelerations: Absent Uterine activityFrequency: Every 3-4 minutes     Prenatal labs: ABO, Rh: O/Positive/-- (06/24 1018) Antibody: Negative (06/24 1018) Rubella: 5.03 (06/24 1018) RPR: Non Reactive (08/19 0826)  HBsAg: Negative (06/24 1018)  HIV: Non Reactive (08/19 0826)  GBS: Negative/-- (10/14 1152)   Prenatal Transfer Tool  Maternal Diabetes: No Genetic Screening: Normal Maternal Ultrasounds/Referrals: Normal Fetal Ultrasounds or other Referrals:  None Maternal Substance Abuse:  No Significant Maternal Medications:  None Significant Maternal Lab Results: Group B Strep negative  No results found for this or any previous visit (from the past 24 hour(s)).  Patient Active Problem List   Diagnosis Date Noted  . Normal labor 07/01/2019  . Elevated blood pressure affecting pregnancy in third trimester, antepartum 06/30/2019  . History of anxiety 03/15/2019  . Vitamin D deficiency 02/20/2019  . Supervision of low-risk pregnancy 02/08/2019    Assessment/Plan:  Kathy Guerrero is a 28 y.o. G3P0020 at 27w6dhere for SOL/SROM  #Labor: expectant management. Anticipate NSVD soon. Elevated BP on admission likely related to pain. Will get HTN labs and continue to monitor after epidural placement.  #Pain: Considering epidural if able #FWB: Cat 1 #ID:  GBS nef #MOF: Breast #MOC: POP #Circ:  nGarwin CNM  07/01/2019, 5:59 PM

## 2019-07-01 NOTE — Progress Notes (Addendum)
Patient still around 9 cm dilated on repeat exam. IUPC placed to monitor adequacy of contractions.   MVUs around 150, not adequate. Will start pitocin

## 2019-07-01 NOTE — Anesthesia Preprocedure Evaluation (Signed)
Anesthesia Evaluation  Patient identified by MRN, date of birth, ID band Patient awake    Reviewed: Allergy & Precautions, H&P , NPO status , Patient's Chart, lab work & pertinent test results  Airway Mallampati: I  TM Distance: >3 FB Neck ROM: full    Dental no notable dental hx. (+) Teeth Intact   Pulmonary neg pulmonary ROS, former smoker,    Pulmonary exam normal breath sounds clear to auscultation       Cardiovascular negative cardio ROS Normal cardiovascular exam Rhythm:regular Rate:Normal     Neuro/Psych negative psych ROS   GI/Hepatic negative GI ROS, Neg liver ROS,   Endo/Other  negative endocrine ROS  Renal/GU negative Renal ROS     Musculoskeletal   Abdominal Normal abdominal exam  (+)   Peds  Hematology negative hematology ROS (+)   Anesthesia Other Findings   Reproductive/Obstetrics (+) Pregnancy                             Anesthesia Physical Anesthesia Plan  ASA: II  Anesthesia Plan: Epidural   Post-op Pain Management:    Induction:   PONV Risk Score and Plan:   Airway Management Planned:   Additional Equipment:   Intra-op Plan:   Post-operative Plan:   Informed Consent: I have reviewed the patients History and Physical, chart, labs and discussed the procedure including the risks, benefits and alternatives for the proposed anesthesia with the patient or authorized representative who has indicated his/her understanding and acceptance.       Plan Discussed with:   Anesthesia Plan Comments:         Anesthesia Quick Evaluation

## 2019-07-01 NOTE — MAU Note (Signed)
Kathy Guerrero is a 28 y.o. at [redacted]w[redacted]d here in MAU reporting:  +worsening contractions +LOF clear +bloody show    Patient heard verbally from triage area. RN went out to get patient to take patient directly to room. RN unable to feel cervix. Fetal position at +1 station. Len Blalock CNM called to bedside to evaluate for transport to LD. SVE 8/100/+1. Patient able to transport; taken via stretcher with RN and CNM to admit bed.  FHT: 130's

## 2019-07-01 NOTE — Anesthesia Procedure Notes (Addendum)
Epidural Patient location during procedure: OB Start time: 07/01/2019 6:37 PM End time: 07/01/2019 6:41 PM  Staffing Anesthesiologist: Lyn Hollingshead, MD Performed: anesthesiologist   Preanesthetic Checklist Completed: patient identified, site marked, surgical consent, pre-op evaluation, timeout performed, IV checked, risks and benefits discussed and monitors and equipment checked  Epidural Patient position: sitting Prep: site prepped and draped and DuraPrep Patient monitoring: continuous pulse ox and blood pressure Approach: midline Location: L3-L4 Injection technique: LOR air  Needle:  Needle type: Tuohy  Needle gauge: 17 G Needle length: 9 cm and 9 Needle insertion depth: 6 cm Catheter type: closed end flexible Catheter size: 19 Gauge Catheter at skin depth: 11 cm Test dose: negative and Other  Assessment Events: blood not aspirated, injection not painful, no injection resistance, negative IV test and paresthesia  Additional Notes R leg X 3 with catheterReason for block:procedure for pain

## 2019-07-01 NOTE — Progress Notes (Signed)
Kathy Guerrero is a 28 y.o. G3P0020 at [redacted]w[redacted]d admitted for SOL with SROM.  Subjective: Comfortable with epidural  Objective: BP 126/90   Pulse 92   Temp 98.5 F (36.9 C) (Oral)   Resp 18   Ht 5\' 4"  (1.626 m)   Wt 84.8 kg   LMP  (LMP Unknown)   SpO2 98%   BMI 32.10 kg/m  No intake/output data recorded.  FHT:  FHR: 120-125 bpm, variability: moderate,  accelerations:  Present,  decelerations:  Present occasional variables UC:   regular, every 2-4 minutes  SVE:   Dilation: 8.5 Effacement (%): 100 Station: Plus 2 Exam by:: Haynes Bast CNM   Labs: Lab Results  Component Value Date   WBC 18.1 (H) 07/01/2019   HGB 13.0 07/01/2019   HCT 38.3 07/01/2019   MCV 90.5 07/01/2019   PLT 319 07/01/2019    Assessment / Plan: Spontaneous labor, progressing normally  Labor: Progressing normally Fetal Wellbeing:  Category II, reassuring for moderate variability, accels Pain Control:  Epidural I/D:  GBS negative Anticipated MOD:  NSVD  Kathy Guerrero Kathy Ratto DO OB Fellow, [facility] 07/01/2019, 8:47 PM

## 2019-07-02 ENCOUNTER — Encounter (HOSPITAL_COMMUNITY): Payer: Self-pay | Admitting: Emergency Medicine

## 2019-07-02 DIAGNOSIS — Z3A40 40 weeks gestation of pregnancy: Secondary | ICD-10-CM

## 2019-07-02 LAB — CBC
HCT: 34.7 % — ABNORMAL LOW (ref 36.0–46.0)
Hemoglobin: 11.8 g/dL — ABNORMAL LOW (ref 12.0–15.0)
MCH: 31.1 pg (ref 26.0–34.0)
MCHC: 34 g/dL (ref 30.0–36.0)
MCV: 91.3 fL (ref 80.0–100.0)
Platelets: 251 10*3/uL (ref 150–400)
RBC: 3.8 MIL/uL — ABNORMAL LOW (ref 3.87–5.11)
RDW: 12.9 % (ref 11.5–15.5)
WBC: 19.6 10*3/uL — ABNORMAL HIGH (ref 4.0–10.5)
nRBC: 0 % (ref 0.0–0.2)

## 2019-07-02 LAB — RPR: RPR Ser Ql: NONREACTIVE

## 2019-07-02 MED ORDER — OXYTOCIN 40 UNITS IN NORMAL SALINE INFUSION - SIMPLE MED
1.0000 m[IU]/min | INTRAVENOUS | Status: DC
  Administered 2019-07-02: 2 m[IU]/min via INTRAVENOUS

## 2019-07-02 MED ORDER — PRENATAL MULTIVITAMIN CH
1.0000 | ORAL_TABLET | Freq: Every day | ORAL | Status: DC
  Administered 2019-07-02 – 2019-07-04 (×3): 1 via ORAL
  Filled 2019-07-02 (×3): qty 1

## 2019-07-02 MED ORDER — IBUPROFEN 600 MG PO TABS
600.0000 mg | ORAL_TABLET | Freq: Four times a day (QID) | ORAL | Status: DC
  Administered 2019-07-02 – 2019-07-04 (×10): 600 mg via ORAL
  Filled 2019-07-02 (×10): qty 1

## 2019-07-02 MED ORDER — DIBUCAINE (PERIANAL) 1 % EX OINT: 1.0000 "application " | TOPICAL_OINTMENT | CUTANEOUS | Status: DC | PRN

## 2019-07-02 MED ORDER — WITCH HAZEL-GLYCERIN EX PADS: 1.0000 "application " | MEDICATED_PAD | CUTANEOUS | Status: DC | PRN

## 2019-07-02 MED ORDER — ONDANSETRON HCL 4 MG/2ML IJ SOLN: 4.0000 mg | INTRAMUSCULAR | Status: DC | PRN

## 2019-07-02 MED ORDER — BENZOCAINE-MENTHOL 20-0.5 % EX AERO
1.0000 "application " | INHALATION_SPRAY | CUTANEOUS | Status: DC | PRN
  Administered 2019-07-02: 1 via TOPICAL
  Filled 2019-07-02: qty 56

## 2019-07-02 MED ORDER — ONDANSETRON HCL 4 MG PO TABS: 4.0000 mg | ORAL_TABLET | ORAL | Status: DC | PRN

## 2019-07-02 MED ORDER — TETANUS-DIPHTH-ACELL PERTUSSIS 5-2.5-18.5 LF-MCG/0.5 IM SUSP: 0.5000 mL | Freq: Once | INTRAMUSCULAR | Status: DC

## 2019-07-02 MED ORDER — TERBUTALINE SULFATE 1 MG/ML IJ SOLN: 0.2500 mg | Freq: Once | INTRAMUSCULAR | Status: DC | PRN

## 2019-07-02 MED ORDER — ACETAMINOPHEN 325 MG PO TABS: 650.0000 mg | ORAL_TABLET | ORAL | Status: DC | PRN

## 2019-07-02 MED ORDER — COCONUT OIL OIL: 1.0000 "application " | TOPICAL_OIL | Status: DC | PRN

## 2019-07-02 MED ORDER — SENNOSIDES-DOCUSATE SODIUM 8.6-50 MG PO TABS
2.0000 | ORAL_TABLET | ORAL | Status: DC
  Administered 2019-07-02 – 2019-07-04 (×2): 2 via ORAL
  Filled 2019-07-02 (×2): qty 2

## 2019-07-02 MED ORDER — DIPHENHYDRAMINE HCL 25 MG PO CAPS: 25.0000 mg | ORAL_CAPSULE | Freq: Four times a day (QID) | ORAL | Status: DC | PRN

## 2019-07-02 MED ORDER — SIMETHICONE 80 MG PO CHEW: 80.0000 mg | CHEWABLE_TABLET | ORAL | Status: DC | PRN

## 2019-07-02 MED ORDER — ZOLPIDEM TARTRATE 5 MG PO TABS: 5.0000 mg | ORAL_TABLET | Freq: Every evening | ORAL | Status: DC | PRN

## 2019-07-02 NOTE — Lactation Note (Signed)
This note was copied from a baby's chart. Lactation Consultation Note  Patient Name: Kathy Guerrero [id number] Date: 07/02/2019 Reason for consult: Follow-up assessment  P1 mother whose infant is now 56 hours old.  RN requested latch assistance.  When I arrived baby was laying in bed with mother and showing no feeding cues.  Mother stated that she had been awake on/off for the past 1 1/2 hours.    Discussed feeding cues with mother and feeding STS when she is ready to latch.  Baby was in a sleeper when I arrived.  Even though it appeared like baby was not ready I offered to assist.  Mother accepted.  Mother removed clothing and I positioned her pillows appropriately for good support.  Discussed positioning at the breast and mother interested in trying the football hold.  Mother's breasts are soft and non tender and nipples are short shafted,intact and compressible.  Previous LC had given mother breast shells and a manual pump.  Attempted to awaken and latch baby to the left breast without success.  She opened her eyes but showed no interest in opening her mouth to latch.  Repeated attempts to latch were unsuccessful.  Suggested mother hold baby STS and continue watching for feeding cues.  Baby rested quietly on mother's chest; covered with a blanket.    Previous LC note mentioned possibly starting a NS, however, I do not see a need for that at this time.  I believe it would be worth waiting until the baby is more awake and ready to feed.  I believe baby may be able to latch.  Mother is familiar with hand expression and will feed back any EBM she obtains to baby.  Asked RN to have night shift RN evaluate tonight to determine if any additional tools/pumping will be necessary.  RN in agreement with this plan.  Father present.   Maternal Data    Feeding Feeding Type: Breast Fed  LATCH Score                   Interventions    Lactation Tools Discussed/Used     Consult  Status Consult Status: Follow-up Date: 07/03/19 Follow-up type: In-patient    Nhung Danko R Cruze Zingaro 07/02/2019, 6:03 PM

## 2019-07-02 NOTE — Anesthesia Postprocedure Evaluation (Signed)
Anesthesia Post Note  Patient: Kathy Guerrero  Procedure(s) Performed: AN AD [place]     Patient location during evaluation: Mother Baby Anesthesia Type: Epidural Level of consciousness: awake and alert and oriented Pain management: satisfactory to patient Vital Signs Assessment: post-procedure vital signs reviewed and stable Respiratory status: spontaneous breathing and nonlabored ventilation Cardiovascular status: stable Postop Assessment: no headache, no backache, no signs of nausea or vomiting, adequate PO intake, patient able to bend at knees and able to ambulate (patient up walking) Anesthetic complications: no    Last Vitals:  Vitals:   07/02/19 0915 07/02/19 1230  BP: 120/75 124/83  Pulse: 83 66  Resp: 17 18  Temp: (!) 36.4 C 36.6 C  SpO2: 99% 99%    Last Pain:  Vitals:   07/02/19 1230  TempSrc: Oral  PainSc: 4    Pain Goal: Patients Stated Pain Goal: 3 (07/02/19 1230)                 Willa Rough

## 2019-07-02 NOTE — Lactation Note (Signed)
This note was copied from a baby's chart. Lactation Consultation Note  Patient Name: Kathy Guerrero Today's Date: 07/02/2019 Reason for consult: Initial assessment;Primapara;1st time breastfeeding Baby is 27 hours old , Difficult latch.  As LC entered the room mom in the bathroom and baby STS with dad due to  Temperature being low per dad.  LC noted baby to be awake and LC recommended and offered to assist with  Latching. Per mom had attempted but the baby had not latched.  LC assessed and showed mom hand expressing with instruction and noted the  Short shaft nipples and semi compressible areolas that will take time.  LC assisted mom with tea hold position and foot ball on the right breast and  Baby sucked for 4-5 times and released.  Baby STS with mom after attempt to obtain a deep latch.  LC instructed mom on the use of shells between feedings except when sleeping. And prior to feeding warm compress, breast massage, hand express, pre - pump to stretch the nipple - areola complex and then reverse pressure.   LC recommended to the [facility] at next attempt to feed is baby doesn't  Latch to call [place] to probably have to start a Nipple shield and a DEBP for post pumping.   [place] pamphlet with phone numbers given to mom with explanation of Support groups and post D/C LC services.   Maternal Data Has patient been taught Hand Expression?: Yes Does the patient have breastfeeding experience prior to this delivery?: No  Feeding Feeding Type: Breast Fed  LATCH Score Latch: Repeated attempts needed to sustain latch, nipple held in mouth throughout feeding, stimulation needed to elicit sucking reflex.  Audible Swallowing: None  Type of Nipple: Everted at rest and after stimulation(semi compressible areolas )  Comfort (Breast/Nipple): Soft / non-tender  Hold (Positioning): Assistance needed to correctly position infant at breast and maintain latch.  LATCH Score:  6  Interventions Interventions: Breast feeding basics reviewed;Assisted with latch;Skin to skin;Breast massage;Hand express;Pre-pump if needed;Reverse pressure;Breast compression;Adjust position;Support pillows;Position options;Shells;Hand pump  Lactation Tools Discussed/Used Pump Review: Setup, frequency, and cleaning;Milk Storage Initiated by:: MAI  Date initiated:: 07/02/19   Consult Status Consult Status: Follow-up Date: 07/02/19 Follow-up type: In-patient    [place] 07/02/2019, 12:41 PM

## 2019-07-02 NOTE — Discharge Summary (Signed)
Postpartum Discharge Summary     Patient Name: Kathy Guerrero DOB: 11-09-1990 MRN: [id number]  Date of admission: 07/01/2019 Delivering Provider: Merilyn Baba   Date of discharge: 07/04/2019  Admitting diagnosis: CTX 5 mins water broke Intrauterine pregnancy: [redacted]w[redacted]d    Secondary diagnosis:  Active Problems:   Normal labor  Additional problems: None     Discharge diagnosis: Term Pregnancy Delivered                                                                                                Post partum procedures:None  Augmentation: Pitocin  Complications:  Hospital course:  Onset of Labor With Vaginal Delivery     28y.o. yo G3P0020 at 423w0das admitted in Active Labor on 07/01/2019, labor was augmented with Pitocin. Patient had an uncomplicated labor course as follows:  Membrane Rupture Time/Date: 5:00 PM ,07/01/2019   Intrapartum Procedures: Episiotomy: None [1]                                         Lacerations:  2nd degree [3];Labial [10]  Patient had a delivery of a Viable infant. 07/02/2019  Information for the patient's newborn:  HaJeraline, Marcinek0[[id number]]Delivery Method: Vag-Spont     Patient had an complicated postpartum course- onset of HTN. Post-partum HTN (131/89 on 11/10, 131/95 and 140/84 on 11/09). Discharged her on Norvasc 59m86maily. She is ambulating, tolerating a regular diet, passing flatus, and urinating well. Patient is discharged home in stable condition on 07/04/19.  Delivery time: 1:57 AM   Magnesium Sulfate received: No BMZ received: No Rhophylac:N/A MMR:N/A Transfusion:No  Physical exam  Vitals:   07/03/19 1412 07/03/19 2232 07/04/19 0529 07/04/19 1115  BP: 129/73 140/84 131/89 139/90  Pulse: 74 63 76 74  Resp: 16  18   Temp: 98.4 F (36.9 C) 97.7 F (36.5 C) 97.7 F (36.5 C)   TempSrc: Oral Oral    SpO2: 100% 99% 99%   Weight:      Height:       General: alert, cooperative and no distress Lochia:  appropriate Chest : No respiratory distress Heart : regular rate, distal pulses intact Abdomen : soft, nontender Uterine Fundus: firm, below umbilicus Incision: N/A DVT Evaluation: No evidence of DVT seen on physical exam, no calf swelling or tenderness Extremities : No lower extremity edema Skin : warm, dry   Labs: Lab Results  Component Value Date   WBC 19.6 (H) 07/02/2019   HGB 11.8 (L) 07/02/2019   HCT 34.7 (L) 07/02/2019   MCV 91.3 07/02/2019   PLT 251 07/02/2019   CMP Latest Ref Rng & Units 07/01/2019  Glucose 70 - 99 mg/dL 108(H)  BUN 6 - 20 mg/dL 8  Creatinine 0.44 - 1.00 mg/dL 0.69  Sodium 135 - 145 mmol/L 131(L)  Potassium 3.5 - 5.1 mmol/L 3.7  Chloride 98 - 111 mmol/L 103  CO2 22 - 32 mmol/L 15(L)  Calcium 8.9 - 10.3 mg/dL 9.5  Total Protein 6.5 - 8.1 g/dL 6.2(L)  Total Bilirubin 0.3 - 1.2 mg/dL 0.9  Alkaline Phos 38 - 126 U/L 117  AST 15 - 41 U/L 19  ALT 0 - 44 U/L 11    Discharge instruction: per After Visit Summary and "Baby and Me Booklet".  After visit meds:  Allergies as of 07/04/2019      Reactions   Dilantin [phenytoin] Rash      Medication List    STOP taking these medications   AMBULATORY NON FORMULARY MEDICATION   oxyCODONE-acetaminophen 5-325 MG tablet Commonly known as: Percocet   zolpidem 6.25 MG CR tablet Commonly known as: Ambien CR     TAKE these medications   acetaminophen 325 MG tablet Commonly known as: Tylenol Take 2 tablets (650 mg total) by mouth every 4 (four) hours as needed (for pain scale < 4).   amLODipine 5 MG tablet Commonly known as: NORVASC Take 1 tablet (5 mg total) by mouth daily.   ibuprofen 600 MG tablet Commonly known as: ADVIL Take 1 tablet (600 mg total) by mouth every 6 (six) hours as needed.   PRENATAL VITAMINS PO Take 1 tablet by mouth daily.       Diet: routine diet  Activity: Advance as tolerated. Pelvic rest for 6 weeks.   Outpatient follow up:4 weeks Follow up Appt: 1 week at  [facility] for BP check Future Appointments  Date Time Provider [place]  08/01/2019  9:35 AM Jorje Guild, NP [place]   [facility]: [place] [facility] Schedule an appointment as soon as possible for a visit in 1 week(s).   Specialty: Obstetrics and Gynecology Why: for blood pressure check Contact information: [address] [place], [place] [id number] [place] [id number] [phone]         Please schedule this patient for Postpartum visit in: 4 weeks with the following provider: Any provider Please schedule this patient for postpartum HTN follow up visit in : 1 week with any PCP provider For C/S patients schedule nurse incision check in weeks 2 weeks: no Low risk pregnancy complicated by: None Delivery mode:  SVD Anticipated Birth Control:  POPs PP Procedures needed: None  Schedule Integrated BH visit: no  Newborn Data: Live born female  Birth Weight:  6'10 APGAR: 8, 9  Newborn Delivery   Birth date/time: 07/02/2019 01:57:00 Delivery type: Vaginal, Spontaneous      Baby Feeding: Breast Disposition:home with mother   Julianne Handler, Mallory Shirk  07/04/2019 12:21 PM

## 2019-07-03 MED ORDER — ACETAMINOPHEN 325 MG PO TABS: 650.0000 mg | ORAL_TABLET | ORAL | Status: DC | PRN

## 2019-07-03 MED ORDER — IBUPROFEN 600 MG PO TABS: 600.0000 mg | ORAL_TABLET | Freq: Four times a day (QID) | ORAL | 0 refills | Status: DC

## 2019-07-03 NOTE — Progress Notes (Addendum)
POSTPARTUM PROGRESS NOTE  Post Partum Day 1  Subjective:  Kathy Guerrero is a 28 y.o. P9X5056 s/p VD at [redacted]w[redacted]d.  She reports she is doing well. No acute events overnight. She denies any problems with ambulating, voiding or po intake. Denies nausea or vomiting.  Pain is well controlled.  Lochia is appropriate.   No questions or concerns at this time.   Objective: Blood pressure (!) 131/95, pulse (!) 111, temperature 97.7 F (36.5 C), temperature source Oral, resp. rate 20, height 5\' 4"  (1.626 m), weight 187 lb (84.8 kg), SpO2 99 %, unknown if currently breastfeeding.  Physical Exam:  General: alert, cooperative and no distress Chest: no respiratory distress Heart:regular rate, distal pulses intact Abdomen: soft, nontender Uterine Fundus: firm, appropriately tender DVT Evaluation: No calf swelling or tenderness Extremities: no lower extremity edema Skin: warm, dry  Recent Labs    07/01/19 1757 07/02/19 0316  HGB 13.0 11.8*  HCT 38.3 34.7*    Assessment/Plan: Kathy Guerrero is a 28 y.o. P7X4801 s/p VD at [redacted]w[redacted]d   PPD#1 - Doing well  Routine postpartum care  Contraception: POPs Feeding: breast  Dispo: Plan for discharge today or tomorrow.   LOS: 2 days   Gifford Shave, MD  PGY-1, [facility]  07/03/2019, 7:49 AM   Provider attestation I have seen and examined this patient and agree with above documentation in the resident's note.   Post Partum Day 1  Kathy Guerrero is a 28 y.o. [id number] s/p SVD.  Pt denies problems with ambulating, voiding or po intake. Pain is well controlled. Method of Feeding: breast  PE:  Gen: well appearing Heart: reg rate Lungs: normal WOB Fundus firm Ext: soft, no pain, no edema  Assessment: S/p SVD PPD #1  Plan for discharge: today or tomorrow -pt would like to be discharged today if baby cleared by peds -elevated BP this morning. Will recheck in the afternoon to determine need for outpatient  antihypertensives   Jorje Guild, NP 11:50 AM

## 2019-07-03 NOTE — Progress Notes (Signed)
CSW received consult for hx of Anxiety.  CSW met with MOB to offer support and complete assessment.    CSW congratulated MOB on the birth of infant. CSW observed that there was another person in the room-using the restroom. CSW asked MOB if it was okay for CSW to continue to speak with other person in the  room and MOB responded "yes its just dad". CSW understanding and proceeded with assessment. MOB was advised of CSW's role and the reason for CSW coming to visit with her. MOB reports that's he was never formally diagnosed with anxiety, however she has dealt with symptoms since mid age years. MOB reports that her anxiety has never been to the point of needing medication or therapy. CSW offered MOB therapy resources at this time and MOB declined. MOB report that she has developed coping skills for herself which include hanging out with friends and positive supports. MOB reports that she has been feeling fine with no other needs at this.   MOB expressed that she has all needed items to care for infant with no further needs.   CSW provided education regarding the baby blues period vs. perinatal mood disorders, discussed treatment and gave resources for mental health follow up if concerns arise.  CSW recommends self-evaluation during the postpartum time period using the New Mom Checklist from Postpartum Progress and encouraged MOB to contact a medical professional if symptoms are noted at any time.   CSW provided review of Sudden Infant Death Syndrome (SIDS) precautions.   CSW identifies no further need for intervention and no barriers to discharge at this time.    Deirdre Gryder S. Aydin Hink, MSW, LCSW [facility] [phone]   

## 2019-07-03 NOTE — Discharge Instructions (Signed)

## 2019-07-03 NOTE — Lactation Note (Signed)
This note was copied from a baby's chart. Lactation Consultation Note Call notified [place] that mom was given #16 NS d/t unable to latch baby. Mom has short shaft everted nipples.  LC gave mom #25 NS, taught application. Suggested mom try #20 NS and wear shells to wean off NS/.  Patient Name: Kathy Guerrero [id number] Date: 07/03/2019 Reason for consult: Follow-up assessment   Maternal Data    Feeding Feeding Type: Breast Fed  LATCH Score Latch: Repeated attempts needed to sustain latch, nipple held in mouth throughout feeding, stimulation needed to elicit sucking reflex.  Audible Swallowing: None  Type of Nipple: Everted at rest and after stimulation(short shaft, semi compressible)  Comfort (Breast/Nipple): Soft / non-tender  Hold (Positioning): Assistance needed to correctly position infant at breast and maintain latch.  LATCH Score: 6  Interventions    Lactation Tools Discussed/Used Tools: Shells Nipple shield size: 20 Shell Type: Inverted   Consult Status Consult Status: Follow-up Date: 07/03/19 Follow-up type: In-patient    Theodoro Kalata 07/03/2019, 12:29 AM

## 2019-07-04 ENCOUNTER — Other Ambulatory Visit: Payer: Medicaid Other

## 2019-07-04 ENCOUNTER — Encounter: Payer: Medicaid Other | Admitting: Obstetrics and Gynecology

## 2019-07-04 DIAGNOSIS — O165 Unspecified maternal hypertension, complicating the puerperium: Secondary | ICD-10-CM

## 2019-07-04 HISTORY — DX: Unspecified maternal hypertension, complicating the puerperium: O16.5

## 2019-07-04 MED ORDER — IBUPROFEN 600 MG PO TABS: 600.0000 mg | ORAL_TABLET | Freq: Four times a day (QID) | ORAL | 0 refills | Status: DC | PRN

## 2019-07-04 MED ORDER — AMLODIPINE BESYLATE 5 MG PO TABS: 5.0000 mg | ORAL_TABLET | Freq: Every day | ORAL | 1 refills | Status: DC

## 2019-07-04 MED ORDER — AMLODIPINE BESYLATE 5 MG PO TABS
5.0000 mg | ORAL_TABLET | Freq: Every day | ORAL | Status: DC
  Administered 2019-07-04: 5 mg via ORAL
  Filled 2019-07-04: qty 1

## 2019-07-04 NOTE — Lactation Note (Signed)
This note was copied from a baby's chart. Lactation Consultation Note  Patient Name: Kathy Guerrero Today's Date: 07/04/2019   A size 24 nipple shield was used & Mom said "it felt 10 times better". Infant's gape was noted to improve in accommodating increase in nipple shield size. Infant fed better in the football hold, as compared to Mom being in the laid-back position. Swallowing was noted & the sucking:swallowing ratio was often 1:1.   Plan:  -Mom will feed infant at breast. Mom shown how to do breast massage.  -Mom will pump 4-6 times/day w/size 27 flange. If infant is not content, parents will feed EBM to infant.  -Mom will F/u with an outpatient lactation appt, a note was sent via Epic to the secretaries of the outpatient clinic &   [facility].   Washing pump parts & how to sanitize nipple shield were discussed/reviewed.   Matthias Hughs [facility] 07/04/2019, 10:12 AM

## 2019-07-04 NOTE — Lactation Note (Signed)
This note was copied from a baby's chart. Lactation Consultation Note  Patient Name: Kathy Guerrero Today's Date: 07/04/2019   Mom's breasts are filling. She affirms that her breasts are bigger & heavier than they were when infant was born.  Mom's nipple tips are abraded bilaterally. Mom said her R nipple bled yesterday, but no bleeding noted at this time.  Mom has been using a size 20 Nipple shield & size 24 flanges with pumping. Mom says that both seem not to allow her nipples to expand. Mom last pumped at 0600, but only got a few mL.   Infant was put to the breast, but was not interested (infant had fed recently). Mom to call out for [place] when ready for [place] to return. We will determine if a size 24 nipple shield is more appropriate & assess feeding in general. Mom says she does see milk in the nipple shield when infant finishes feeding.   Matthias Hughs [facility] 07/04/2019, 9:09 AM

## 2019-07-06 ENCOUNTER — Encounter: Payer: Medicaid Other | Admitting: Obstetrics and Gynecology

## 2019-07-06 ENCOUNTER — Other Ambulatory Visit: Payer: Medicaid Other

## 2019-08-01 ENCOUNTER — Telehealth: Payer: Medicaid Other | Admitting: Student

## 2019-08-01 ENCOUNTER — Encounter: Payer: Self-pay | Admitting: Student

## 2019-08-01 ENCOUNTER — Other Ambulatory Visit: Payer: Self-pay

## 2019-08-01 NOTE — Progress Notes (Signed)
Pt did not keep virtual appointment today 

## 2019-08-01 NOTE — Progress Notes (Signed)
@  932AM LVM that I was calling for appointment and for her to log in so that I may complete her appointment, will attempt to call back in a few mins @942am  LVM to call office back for appointment. Will attempt one more time.

## 2019-10-03 ENCOUNTER — Telehealth (INDEPENDENT_AMBULATORY_CARE_PROVIDER_SITE_OTHER): Payer: Medicaid Other | Admitting: Student

## 2019-10-03 DIAGNOSIS — Z30011 Encounter for initial prescription of contraceptive pills: Secondary | ICD-10-CM

## 2019-10-03 MED ORDER — NORETHINDRONE 0.35 MG PO TABS: 1.0000 | ORAL_TABLET | Freq: Every day | ORAL | 11 refills | Status: DC

## 2019-10-03 NOTE — Progress Notes (Signed)
I connected with  Kathy Guerrero on 10/03/19 at  3:55 PM EST by telephone and verified that I am speaking with the correct person using two identifiers.   I discussed the limitations, risks, security and privacy concerns of performing an evaluation and management service by telephone and the availability of in person appointments. I also discussed with the patient that there may be a patient responsible charge related to this service. The patient expressed understanding and agreed to proceed.  [name], CMA 10/03/2019  4:05 PM

## 2019-10-03 NOTE — Progress Notes (Signed)
    TELEHEALTH GYNECOLOGY VIRTUAL VIDEO VISIT ENCOUNTER NOTE  Provider location: [facility] [place]   I connected with Kathy Guerrero on 10/03/19 at  3:55 PM EST by MyChart Video Encounter at home and verified that I am speaking with the correct person using two identifiers.   I discussed the limitations, risks, security and privacy concerns of performing an evaluation and management service virtually and the availability of in person appointments. I also discussed with the patient that there may be a patient responsible charge related to this service. The patient expressed understanding and agreed to proceed.   History:  Kathy Guerrero is a 29 y.o. [phone] female being evaluated today for contraception management. She denies any abnormal vaginal discharge, bleeding, pelvic pain or other concerns.   She has previously used birth control pills and is interested in that now. She would like more kids in the next few years so would not like a LARC.      Past Medical History:  Diagnosis Date  . Seizures (HCC)    as a child, none since then  . UTI (urinary tract infection)    Past Surgical History:  Procedure Laterality Date  . NO PAST SURGERIES     The following portions of the patient's history were reviewed and updated as appropriate: allergies, current medications, past family history, past medical history, past social history, past surgical history and problem list.   Health Maintenance:  Normal pap and negative HRHPV on 02/15/2019 .   Review of Systems:  Pertinent items noted in HPI and remainder of comprehensive ROS otherwise negative.  Physical Exam:   General:  Alert, oriented and cooperative. Patient appears to be in no acute distress.  Mental Status: Normal mood and affect. Normal behavior. Normal judgment and thought content.   Respiratory: Normal respiratory effort, no problems with respiration noted  Rest of physical exam deferred due to type  of encounter  Labs and Imaging No results found for this or any previous visit (from the past 336 hour(s)). No results found.     Assessment and Plan:     1. Encounter for initial prescription of contraceptive pills -pt had gestational hypertension. Discussed with patient that she would need to come in for BP check if wants to have CHC. She is agreeable with POPs at this time. Will use for at least 3 cycles, if would like to try [facility] at that time will check BP in the office.   - norethindrone (MICRONOR) 0.35 MG tablet; Take 1 tablet (0.35 mg total) by mouth daily.  Dispense: 1 Package; Refill: 11       I discussed the assessment and treatment plan with the patient. The patient was provided an opportunity to ask questions and all were answered. The patient agreed with the plan and demonstrated an understanding of the instructions.   The patient was advised to call back or seek an in-person evaluation/go to the ED if the symptoms worsen or if the condition fails to improve as anticipated.  I provided 9 minutes of face-to-face time during this encounter.   Judeth Horn, NP [facility], [facility]

## 2020-02-13 ENCOUNTER — Other Ambulatory Visit: Payer: Self-pay | Admitting: Certified Nurse Midwife

## 2020-02-22 DIAGNOSIS — Z419 Encounter for procedure for purposes other than remedying health state, unspecified: Secondary | ICD-10-CM | POA: Diagnosis not present

## 2020-03-24 DIAGNOSIS — Z419 Encounter for procedure for purposes other than remedying health state, unspecified: Secondary | ICD-10-CM | POA: Diagnosis not present

## 2020-04-15 ENCOUNTER — Encounter: Payer: Self-pay | Admitting: Medical

## 2020-04-24 DIAGNOSIS — Z419 Encounter for procedure for purposes other than remedying health state, unspecified: Secondary | ICD-10-CM | POA: Diagnosis not present

## 2020-05-24 DIAGNOSIS — Z419 Encounter for procedure for purposes other than remedying health state, unspecified: Secondary | ICD-10-CM | POA: Diagnosis not present

## 2020-06-24 DIAGNOSIS — Z419 Encounter for procedure for purposes other than remedying health state, unspecified: Secondary | ICD-10-CM | POA: Diagnosis not present

## 2020-07-09 ENCOUNTER — Ambulatory Visit: Payer: Medicaid Other | Admitting: Advanced Practice Midwife

## 2020-07-24 DIAGNOSIS — Z419 Encounter for procedure for purposes other than remedying health state, unspecified: Secondary | ICD-10-CM | POA: Diagnosis not present

## 2020-08-24 DIAGNOSIS — Z419 Encounter for procedure for purposes other than remedying health state, unspecified: Secondary | ICD-10-CM | POA: Diagnosis not present

## 2020-08-29 IMAGING — US US MFM OB COMP + 14 WK
1 series · 13 of 28 positions shown · non-contrast
Comparison: none

[Series 1: us mfm ob comp + 14 wk · 13 of 58 slices shown]
[im 3/58]
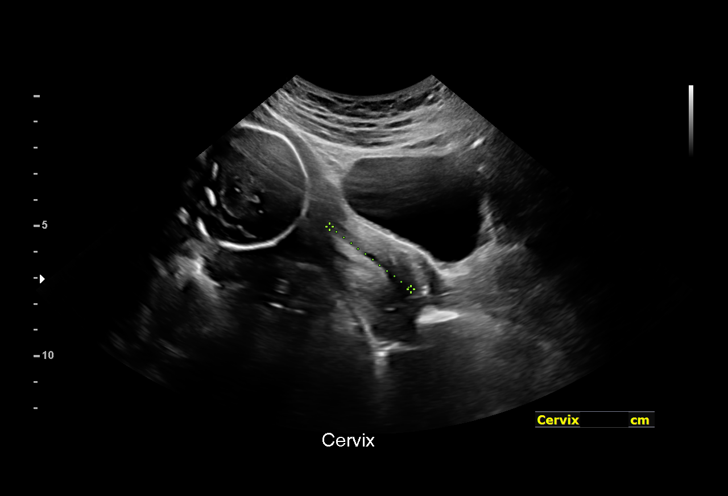
[im 7/58]
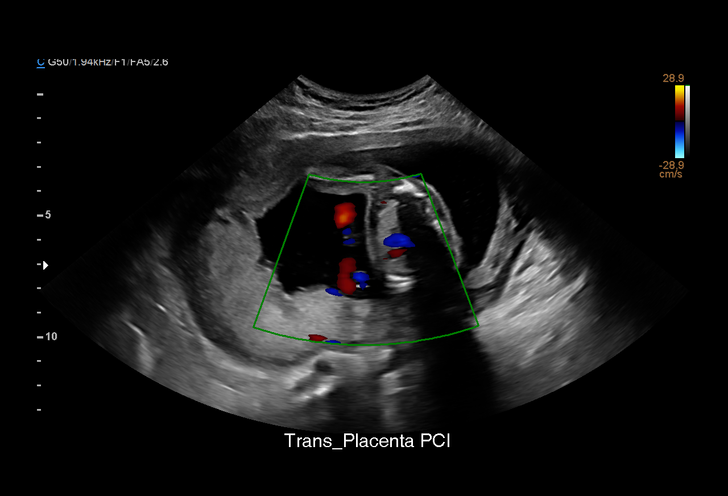
[im 11/58]
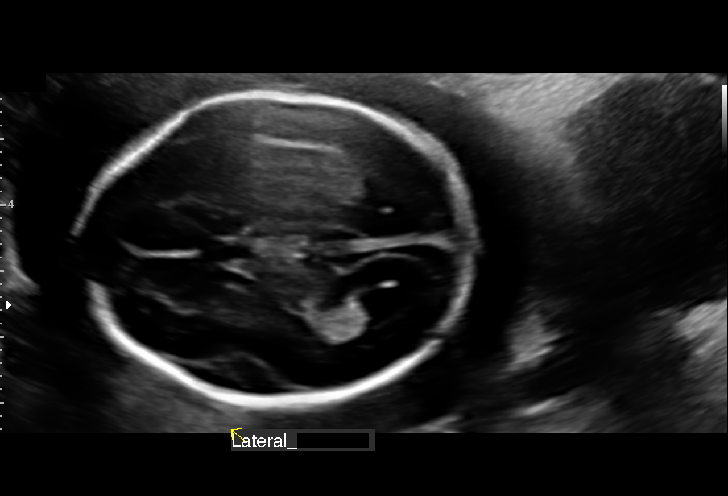
[im 15/58]
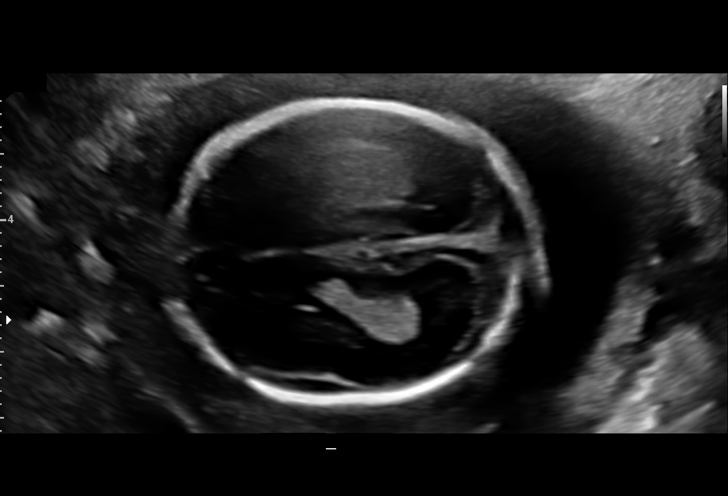
[im 20/58]
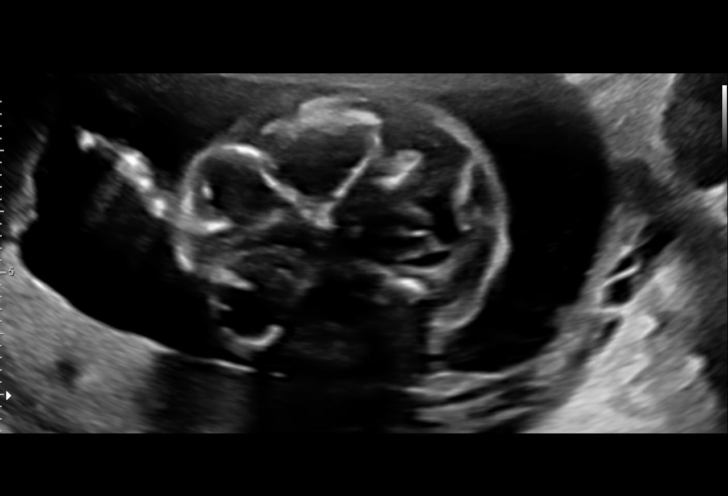
[im 24/58]
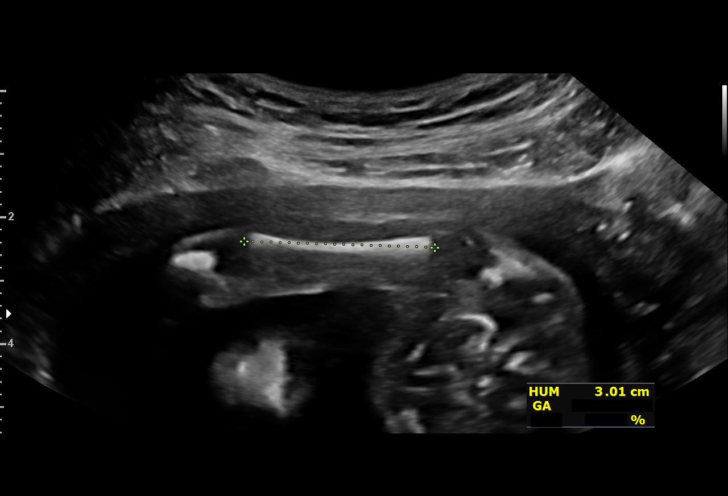
[im 30/58]
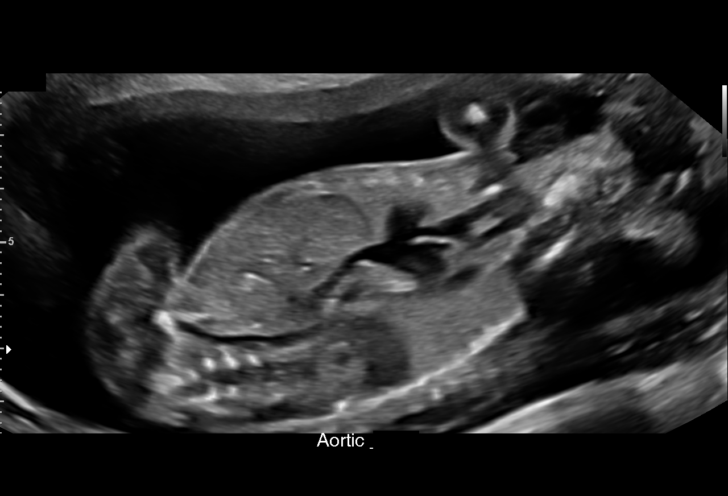
[im 34/58]
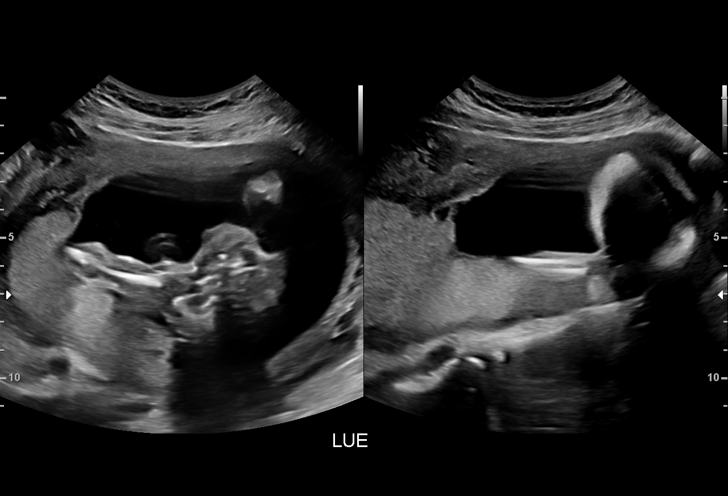
[im 39/58]
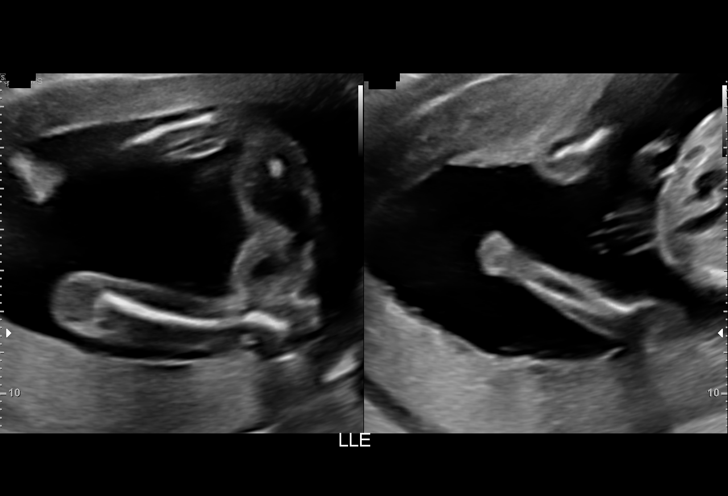
[im 43/58]
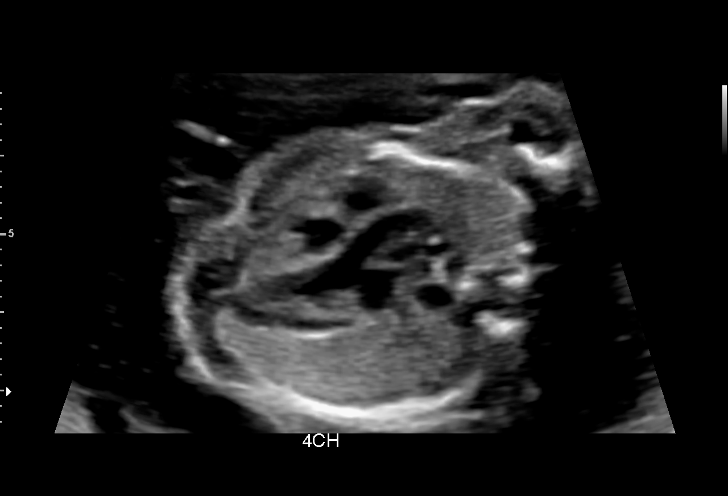
[im 47/58]
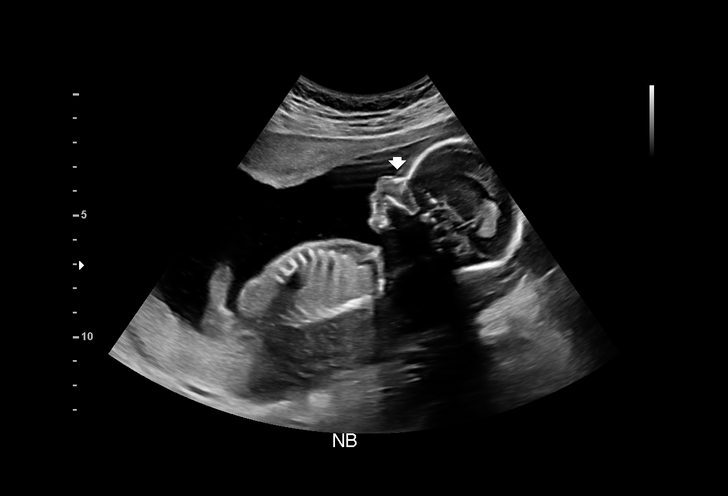
[im 51/58]
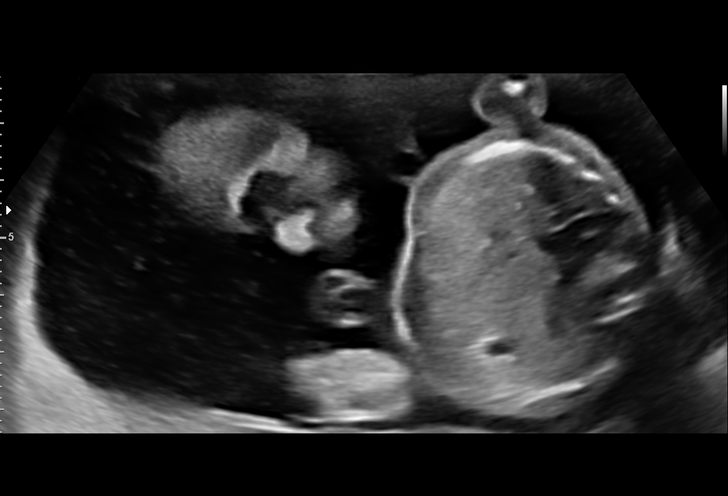
[im 55/58]
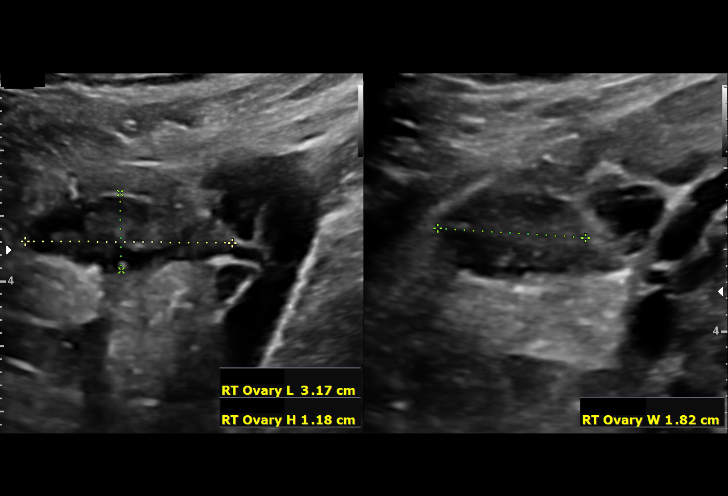

[13 of 28 positions shown; findings below may reference images not displayed]

1  US MFM OB COMP + 14 WK               76805.01     ELVIRABAGIROVA MAGABLOV
 ----------------------------------------------------------------------

 ----------------------------------------------------------------------
Indications

  Encounter for antenatal screening for
  malformations ( Panorama Done 02/15/19)
  [DATE] weeks gestation of pregnancy
 ----------------------------------------------------------------------
Vital Signs

 BMI:
Fetal Evaluation

 Num Of Fetuses:          1
 Fetal Heart Rate(bpm):   141
 Cardiac Activity:        Observed
 Presentation:            Cephalic
 Placenta:                Posterior
 P. Cord Insertion:       Visualized

 Amniotic Fluid
 AFI FV:      Within normal limits

                             Largest Pocket(cm)

Biometry

 BPD:      49.4  mm     G. Age:  21w 0d         71  %    CI:        79.43   %    70 - 86
                                                         FL/HC:       18.9  %    16.8 -
 HC:      175.2  mm     G. Age:  20w 0d         24  %    HC/AC:       1.11       1.09 -
 AC:      157.3  mm     G. Age:  20w 6d         59  %    FL/BPD:      67.0  %
 FL:       33.1  mm     G. Age:  20w 2d         39  %    FL/AC:       21.0  %    20 - 24
 HUM:      28.8  mm     G. Age:  19w 2d         22  %
 CER:      19.9  mm     G. Age:  19w 0d         16  %
 NFT:       5.1  mm

 LV:        4.8  mm
 CM:        2.7  mm

 Est. FW:     364   gm   0 lb 13 oz      54  %
OB History

 Gravidity:    3
 TOP:          2        Living:  0
Gestational Age

 U/S Today:     20w 4d                                        EDD:   07/01/19
 Best:          20w 3d     Det. By:  Previous Ultrasound      EDD:   07/02/19
                                     (02/01/19)
Anatomy

 Cranium:               Appears normal         Aortic Arch:            Appears normal
 Cavum:                 Appears normal         Ductal Arch:            Not well visualized
 Ventricles:            Appears normal         Diaphragm:              Appears normal
 Choroid Plexus:        Appears normal         Stomach:                Appears normal, left
                                                                       sided
 Cerebellum:            Appears normal         Abdomen:                Appears normal
 Posterior Fossa:       Appears normal         Abdominal Wall:         Appears nml (cord
                                                                       insert, abd wall)
 Nuchal Fold:           Not applicable (>20    Cord Vessels:           Not well visualized
                        wks GA)
 Face:                  Orbits and profile     Kidneys:                Appear normal
                        previously seen
 Lips:                  Appears normal         Bladder:                Appears normal
 Thoracic:              Appears normal         Spine:                  Not well visualized
 Heart:                 Not well visualized    Upper Extremities:      Appears normal
 RVOT:                  Not well visualized    Lower Extremities:      Appears normal
 LVOT:                  Not well visualized

 Other:  Nasal bone visualized. Female gender Technically difficult due to fetal
         position.
Cervix Uterus Adnexa

 Cervix
 Length:           3.93  cm.
 Normal appearance by transabdominal scan.

 Uterus
 No abnormality visualized.

 Left Ovary
 Within normal limits.

 Right Ovary
 Within normal limits.

 Cul De Sac
 No free fluid seen.
 Adnexa
 No abnormality visualized. No adnexal mass
 visualized. No free fluid.
Impression

 Normal interval growth.  No ultrasonic evidence of structural
 fetal anomalies.
 Suboptimal views of the fetal anatomy were obtained
 secondary to fetal position.
Recommendations

 Follow up anatomy in 4 weeks.

## 2020-09-24 DIAGNOSIS — Z419 Encounter for procedure for purposes other than remedying health state, unspecified: Secondary | ICD-10-CM | POA: Diagnosis not present

## 2020-09-25 IMAGING — US US MFM OB FOLLOW UP
1 series · 13 of 28 positions shown · non-contrast
Comparison: none

[Series 1: us mfm ob follow up · 13 of 82 slices shown]
[im 4/82]
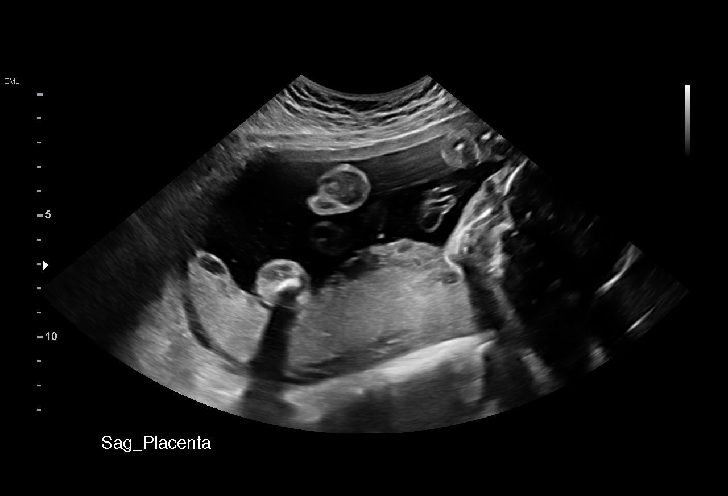
[im 10/82]
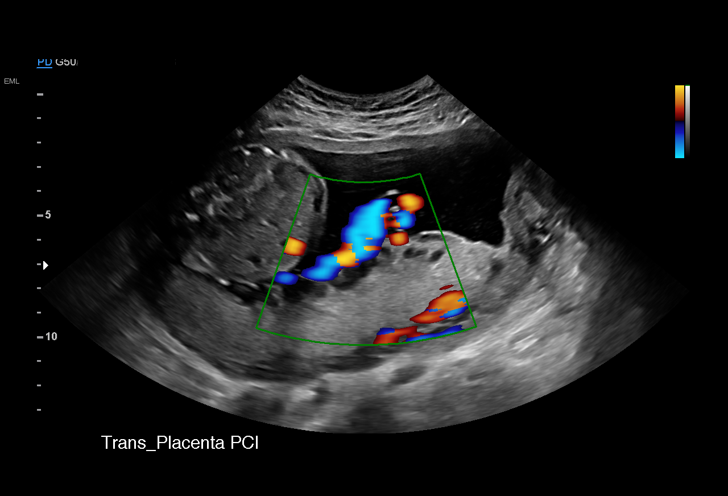
[im 16/82]
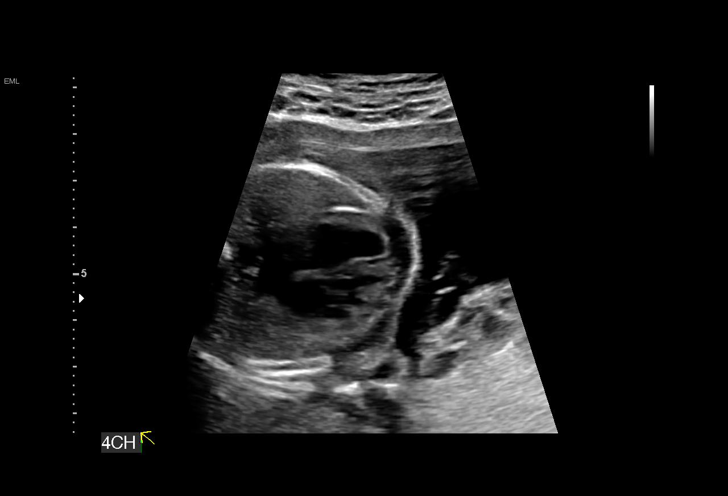
[im 22/82]
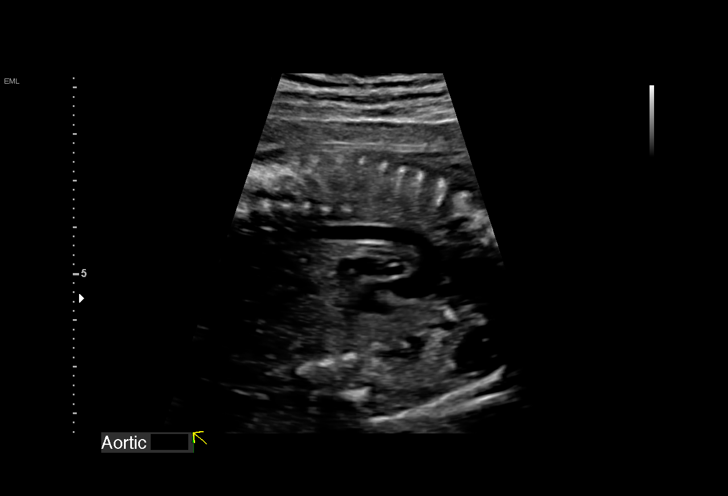
[im 28/82]
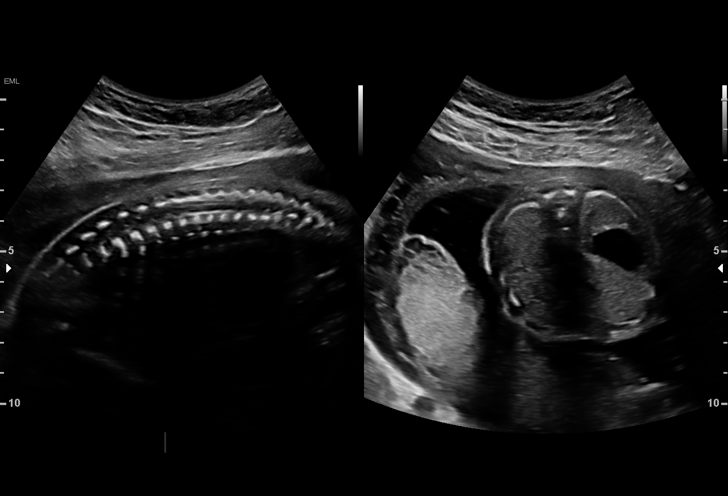
[im 34/82]
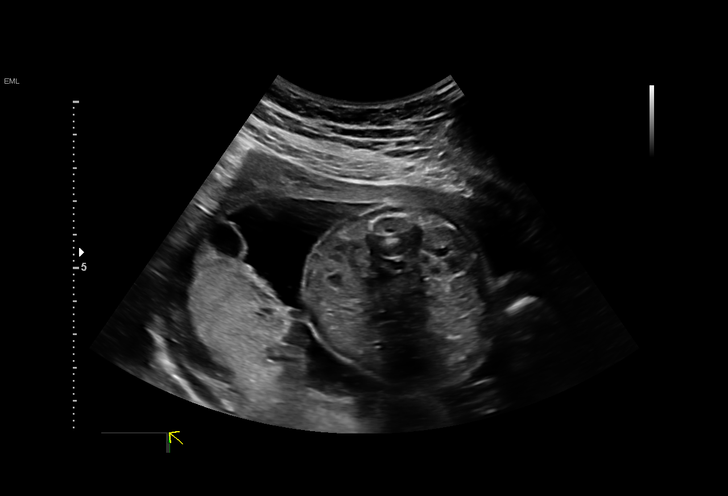
[im 43/82]
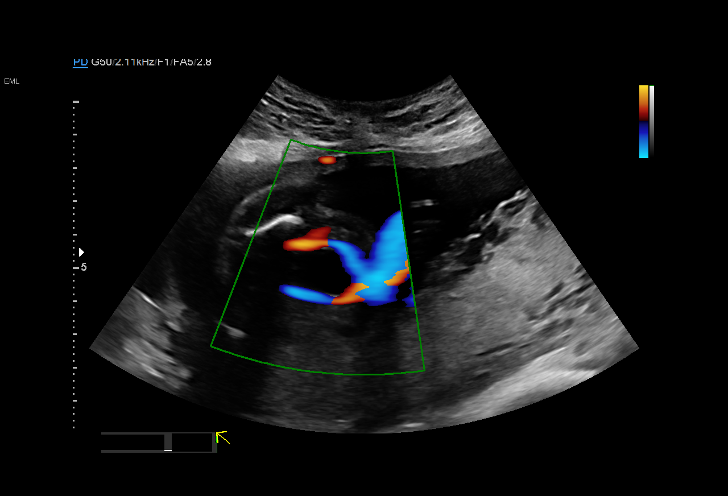
[im 49/82]
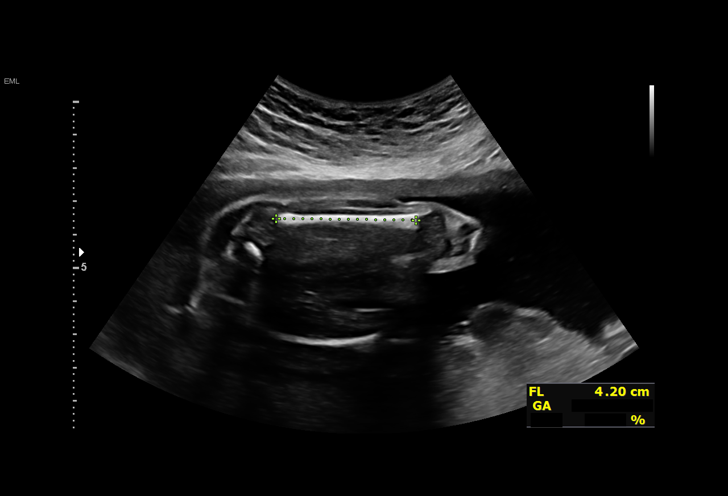
[im 55/82]
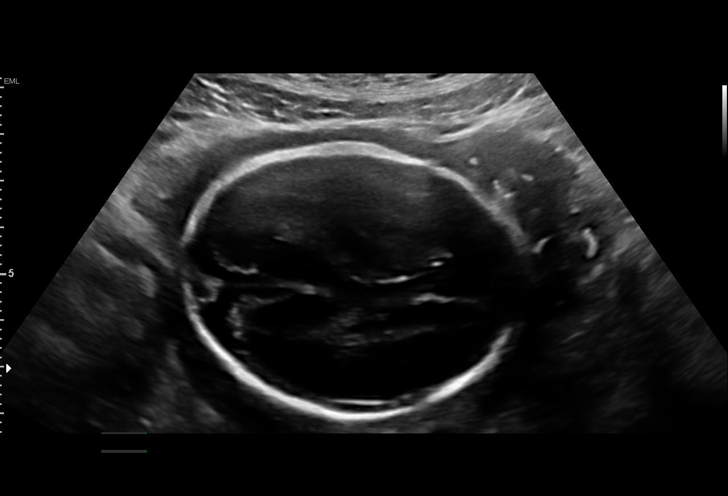
[im 61/82]
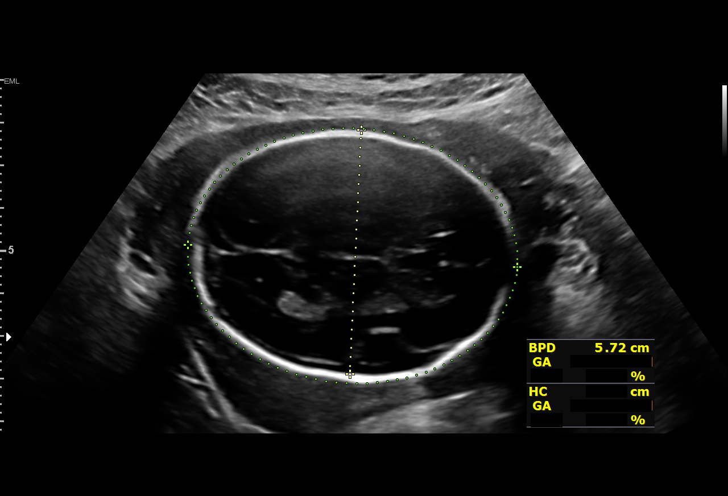
[im 67/82]
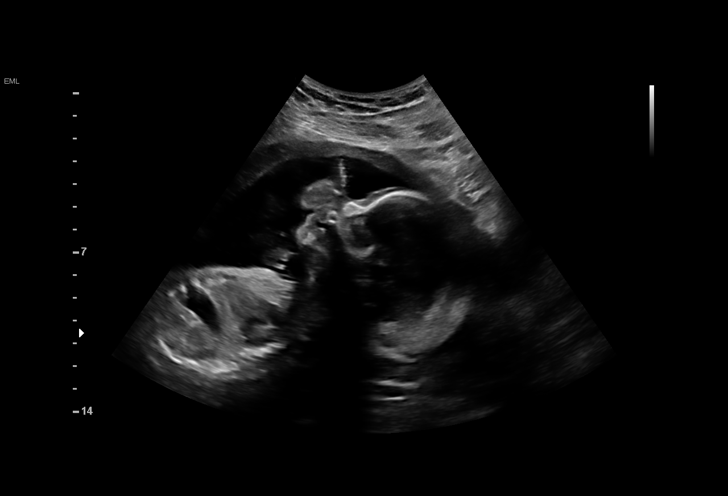
[im 73/82]
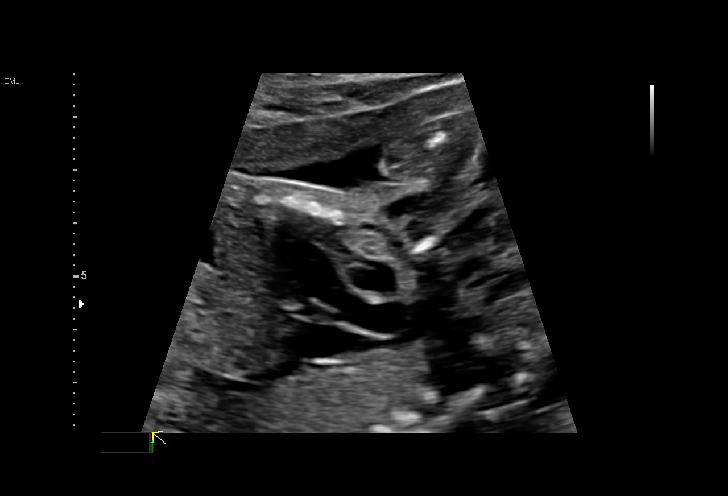
[im 79/82]
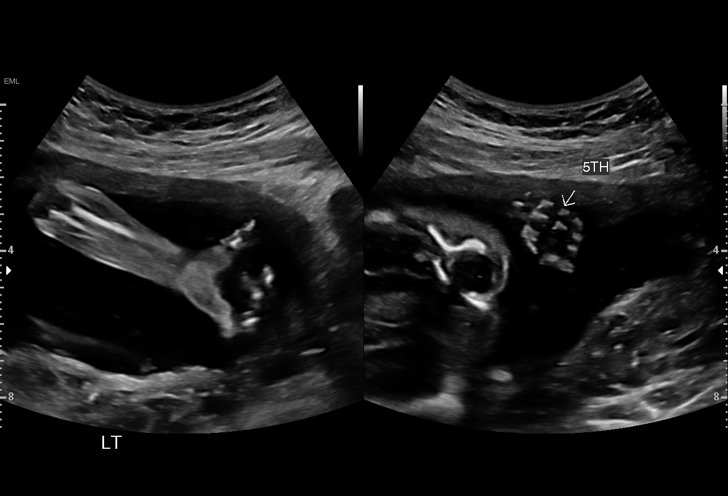

[13 of 28 positions shown; findings below may reference images not displayed]

RANDEL-PEEP
 ----------------------------------------------------------------------

 ----------------------------------------------------------------------
Indications

  24 weeks gestation of pregnancy
  Encounter for antenatal screening for
  malformations ( Panorama Done 02/15/19)
 ----------------------------------------------------------------------
Vital Signs

 BMI:
Fetal Evaluation

 Num Of Fetuses:         1
 Fetal Heart Rate(bpm):  150
 Cardiac Activity:       Observed
 Presentation:           Cephalic
 Placenta:               Posterior
 P. Cord Insertion:      Visualized, central

 Amniotic Fluid
 AFI FV:      Within normal limits

                             Largest Pocket(cm)

Biometry

 BPD:      57.2  mm     G. Age:  23w 4d         17  %    CI:        70.88   %    70 - 86
                                                         FL/HC:      19.2   %    18.7 -
 HC:      216.5  mm     G. Age:  23w 5d         13  %    HC/AC:      1.09        1.05 -
 AC:       199   mm     G. Age:  24w 4d         50  %    FL/BPD:     72.7   %    71 - 87
 FL:       41.6  mm     G. Age:  23w 4d         16  %    FL/AC:      20.9   %    20 - 24
 HUM:      38.3  mm     G. Age:  23w 4d         24  %
 LV:        4.3  mm

 Est. FW:     654  gm      1 lb 7 oz     30  %
OB History

 Gravidity:    3
 TOP:          2        Living:  0
Gestational Age

 U/S Today:     23w 6d                                        EDD:   07/05/19
 Best:          24w 2d     Det. By:  Previous Ultrasound      EDD:   07/02/19
                                     (02/01/19)
Anatomy

 Cranium:               Appears normal         LVOT:                   Appears normal
 Cavum:                 Appears normal         Aortic Arch:            Appears normal
 Ventricles:            Appears normal         Ductal Arch:            Appears normal
 Choroid Plexus:        Appears normal         Diaphragm:              Appears normal
 Cerebellum:            Appears normal         Stomach:                Appears normal, left
                                                                       sided
 Posterior Fossa:       Appears normal         Abdomen:                Appears normal
 Nuchal Fold:           Not applicable (>20    Abdominal Wall:         Appears nml (cord
                        wks GA)                                        insert, abd wall)
 Face:                  Appears normal         Cord Vessels:           Appears normal (3
                        (orbits and profile)                           vessel cord)
 Lips:                  Previously seen        Kidneys:                Appear normal
 Palate:                Appears normal         Bladder:                Appears normal
 Thoracic:              Appears normal         Spine:                  Appears normal
 Heart:                 Appears normal         Upper Extremities:      Previously seen
                        (4CH, axis, and
                        situs)
 RVOT:                  Appears normal         Lower Extremities:      Previously seen

 Other:  Fetus appears to be female. Nasal bone visualized. Heels and 5th
         digit visualized.
Cervix Uterus Adnexa

 Cervix
 Normal appearance by transabdominal scan.

 Left Ovary
 Not visualized.

 Right Ovary
 Within normal limits.

 Adnexa
 No abnormality visualized.
Impression

  Patient returned for completion of fetal anatomy. Amniotic
 fluid is normal and good fetal activity is seen. Fetal growth is
 appropriate for gestational age. Fetal anatomical survey was
 completed and appears normal.
Recommendations

 Follow-up scans as clinically indicated.
                 Gujis, Jonyys

## 2020-10-22 DIAGNOSIS — Z419 Encounter for procedure for purposes other than remedying health state, unspecified: Secondary | ICD-10-CM | POA: Diagnosis not present

## 2020-11-22 DIAGNOSIS — Z419 Encounter for procedure for purposes other than remedying health state, unspecified: Secondary | ICD-10-CM | POA: Diagnosis not present

## 2020-12-22 DIAGNOSIS — Z419 Encounter for procedure for purposes other than remedying health state, unspecified: Secondary | ICD-10-CM | POA: Diagnosis not present

## 2021-01-22 DIAGNOSIS — Z419 Encounter for procedure for purposes other than remedying health state, unspecified: Secondary | ICD-10-CM | POA: Diagnosis not present

## 2021-02-21 DIAGNOSIS — Z419 Encounter for procedure for purposes other than remedying health state, unspecified: Secondary | ICD-10-CM | POA: Diagnosis not present

## 2021-03-24 DIAGNOSIS — Z419 Encounter for procedure for purposes other than remedying health state, unspecified: Secondary | ICD-10-CM | POA: Diagnosis not present

## 2021-04-24 DIAGNOSIS — Z419 Encounter for procedure for purposes other than remedying health state, unspecified: Secondary | ICD-10-CM | POA: Diagnosis not present

## 2021-05-24 DIAGNOSIS — Z419 Encounter for procedure for purposes other than remedying health state, unspecified: Secondary | ICD-10-CM | POA: Diagnosis not present

## 2021-06-24 DIAGNOSIS — Z419 Encounter for procedure for purposes other than remedying health state, unspecified: Secondary | ICD-10-CM | POA: Diagnosis not present

## 2021-07-24 DIAGNOSIS — Z419 Encounter for procedure for purposes other than remedying health state, unspecified: Secondary | ICD-10-CM | POA: Diagnosis not present

## 2021-08-24 DIAGNOSIS — Z419 Encounter for procedure for purposes other than remedying health state, unspecified: Secondary | ICD-10-CM | POA: Diagnosis not present

## 2021-09-24 DIAGNOSIS — Z419 Encounter for procedure for purposes other than remedying health state, unspecified: Secondary | ICD-10-CM | POA: Diagnosis not present

## 2021-10-22 DIAGNOSIS — Z419 Encounter for procedure for purposes other than remedying health state, unspecified: Secondary | ICD-10-CM | POA: Diagnosis not present

## 2021-11-22 DIAGNOSIS — Z419 Encounter for procedure for purposes other than remedying health state, unspecified: Secondary | ICD-10-CM | POA: Diagnosis not present

## 2021-12-22 DIAGNOSIS — Z419 Encounter for procedure for purposes other than remedying health state, unspecified: Secondary | ICD-10-CM | POA: Diagnosis not present

## 2022-01-22 DIAGNOSIS — Z419 Encounter for procedure for purposes other than remedying health state, unspecified: Secondary | ICD-10-CM | POA: Diagnosis not present

## 2022-02-21 DIAGNOSIS — Z419 Encounter for procedure for purposes other than remedying health state, unspecified: Secondary | ICD-10-CM | POA: Diagnosis not present

## 2022-03-24 DIAGNOSIS — Z419 Encounter for procedure for purposes other than remedying health state, unspecified: Secondary | ICD-10-CM | POA: Diagnosis not present

## 2022-04-24 DIAGNOSIS — Z419 Encounter for procedure for purposes other than remedying health state, unspecified: Secondary | ICD-10-CM | POA: Diagnosis not present

## 2022-05-24 DIAGNOSIS — Z419 Encounter for procedure for purposes other than remedying health state, unspecified: Secondary | ICD-10-CM | POA: Diagnosis not present

## 2022-06-24 DIAGNOSIS — Z419 Encounter for procedure for purposes other than remedying health state, unspecified: Secondary | ICD-10-CM | POA: Diagnosis not present

## 2022-07-24 DIAGNOSIS — Z419 Encounter for procedure for purposes other than remedying health state, unspecified: Secondary | ICD-10-CM | POA: Diagnosis not present

## 2022-08-24 DIAGNOSIS — Z419 Encounter for procedure for purposes other than remedying health state, unspecified: Secondary | ICD-10-CM | POA: Diagnosis not present

## 2022-09-24 DIAGNOSIS — Z419 Encounter for procedure for purposes other than remedying health state, unspecified: Secondary | ICD-10-CM | POA: Diagnosis not present

## 2022-10-13 ENCOUNTER — Ambulatory Visit
Admission: EM | Admit: 2022-10-13 | Discharge: 2022-10-13 | Disposition: A | Discharge status: Home / Self Care | Payer: Medicaid Other | Attending: Physician Assistant | Admitting: Physician Assistant

## 2022-10-13 ENCOUNTER — Encounter: Payer: Self-pay | Admitting: Emergency Medicine

## 2022-10-13 ENCOUNTER — Other Ambulatory Visit: Payer: Self-pay

## 2022-10-13 DIAGNOSIS — R300 Dysuria: Secondary | ICD-10-CM | POA: Diagnosis not present

## 2022-10-13 DIAGNOSIS — N3090 Cystitis, unspecified without hematuria: Secondary | ICD-10-CM | POA: Diagnosis not present

## 2022-10-13 LAB — POCT URINALYSIS DIP (MANUAL ENTRY)
Bilirubin, UA: NEGATIVE
Glucose, UA: 100 mg/dL — AB
Nitrite, UA: POSITIVE — AB
Protein Ur, POC: NEGATIVE mg/dL
Spec Grav, UA: 1.025 (ref 1.010–1.025)
Urobilinogen, UA: 0.2 E.U./dL
pH, UA: 5 (ref 5.0–8.0)

## 2022-10-13 MED ORDER — NITROFURANTOIN MONOHYD MACRO 100 MG PO CAPS: 100.0000 mg | ORAL_CAPSULE | Freq: Two times a day (BID) | ORAL | 0 refills | Status: DC

## 2022-10-13 NOTE — ED Triage Notes (Signed)
Pt here for dysuria x 3 days

## 2022-10-13 NOTE — Discharge Instructions (Addendum)
Advised take the Macrobid every 12 hours until completed to treat the urinary tract infection. Advised to use the Azo as you need to help reduce pain and pressure on urination. Advised to increase fluid intake along with cranberry juice to try to flush the urinary system.  Advised follow-up PCP or return to urgent care as needed.

## 2022-10-13 NOTE — ED Provider Notes (Signed)
[facility]    CSN: [id number] Arrival date & time: 10/13/22  1224      History   Chief Complaint Chief Complaint  Patient presents with   Dysuria    HPI Kathy Guerrero is a 32 y.o. female.   32 year old female presents with dysuria and frequency.  Patient indicates for the past 2 weeks she has been having intermittent but progressive symptoms of frequency, urgency, dysuria.  She has also been having lower abdominal pain and pressure.  She indicates she has had some intermittent mild lower back pain, and has felt like that she has had mild fever without chills, nausea or vomiting.  Patient indicates that her symptoms started after having intercourse, she also relates that her last period was 2 weeks ago and was normal.  She has been taking Azo with minimal improvement of her symptoms.  She is tolerating fluids well.   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 Authorizing Provider  nitrofurantoin, macrocrystal-monohydrate, (MACROBID) 100 MG capsule Take 1 capsule (100 mg total) by mouth 2 (two) times daily. 10/13/22  Yes Nyoka Lint, PA-C  acetaminophen (TYLENOL) 325 MG tablet Take 2 tablets (650 mg total) by mouth every 4 (four) hours as needed (for pain scale < 4). Patient not taking: Reported on 10/03/2019 07/03/19   Concepcion Living, MD  amLODipine  (NORVASC) 5 MG tablet Take 1 tablet (5 mg total) by mouth daily. Patient not taking: Reported on 10/03/2019 07/04/19   Julianne Handler, CNM  ibuprofen (ADVIL) 600 MG tablet Take 1 tablet (600 mg total) by mouth every 6 (six) hours as needed. Patient not taking: Reported on 10/03/2019 07/04/19   Julianne Handler, CNM  norethindrone (MICRONOR) 0.35 MG tablet Take 1 tablet (0.35 mg total) by mouth daily. 10/03/19   Jorje Guild, NP  Prenatal Vit-Fe Fumarate-FA (PRENATAL VITAMINS PO) Take 1 tablet by mouth daily.    [provider]  Vitamin D, Ergocalciferol, (DRISDOL) 1.25 MG (50000 UNIT) CAPS capsule Take 50,000 Units by mouth once a week. 09/22/19   [provider]    Family History Family History  Problem Relation Age of Onset   Hypertension Father     Social History Social History   Tobacco Use   Smoking status: Former    Packs/day: 0.50    Types: Cigarettes    Quit date: 01/23/2019    Years since quitting: 3.7   Smokeless tobacco: Never  Vaping Use   Vaping Use: Former  Substance Use Topics   Alcohol use: Not Currently  Comment: socially not since pregnancy   Drug use: No     Allergies   Dilantin [phenytoin]   Review of Systems Review of Systems  Genitourinary:  Positive for dysuria, frequency and urgency.     Physical Exam Triage Vital Signs ED Triage Vitals  Enc Vitals Group     BP 10/13/22 1325 (!) 173/99     Pulse Rate 10/13/22 1325 95     Resp 10/13/22 1325 18     Temp 10/13/22 1325 98 F (36.7 C)     Temp Source 10/13/22 1325 Oral     SpO2 10/13/22 1325 95 %     Weight --      Height --      Head Circumference --      Peak Flow --      Pain Score 10/13/22 1327 2     Pain Loc --      Pain Edu? --      Excl. in [place]? --    No data found.  Updated Vital Signs BP (!) 173/99 (BP Location: Left Arm)   Pulse 95   Temp 98 F (36.7 C) (Oral)   Resp 18   SpO2 95%   Visual Acuity Right Eye Distance:   Left Eye Distance:   Bilateral  Distance:    Right Eye Near:   Left Eye Near:    Bilateral Near:     Physical Exam Constitutional:      Appearance: Normal appearance.  Abdominal:     General: Abdomen is flat. Bowel sounds are normal.     Palpations: Abdomen is soft.     Tenderness: There is no abdominal tenderness. There is no right CVA tenderness or left CVA tenderness.  Neurological:     Mental Status: She is alert.      UC Treatments / Results  Labs (all labs ordered are listed, but only abnormal results are displayed) Labs Reviewed  POCT URINALYSIS DIP (MANUAL ENTRY) - Abnormal; Notable for the following components:      Result Value   Color, UA orange (*)    Glucose, UA =100 (*)    Ketones, POC UA small (15) (*)    Blood, UA trace-intact (*)    Nitrite, UA Positive (*)    Leukocytes, UA Large (3+) (*)    All other components within normal limits  URINE CULTURE    EKG   Radiology No results found.  Procedures Procedures (including critical care time)  Medications Ordered in UC Medications - No data to display  Initial Impression / Assessment and Plan / UC Course  I have reviewed the triage vital signs and the nursing notes.  Pertinent labs & imaging results that were available during my care of the patient were reviewed by me and considered in my medical decision making (see chart for details).    Plan: The diagnosis will be treated with the following: 1.  Cystitis: A.  Macrobid every 12 hours to treat infection. B.  Urine culture is pending. 2.  Dysuria: A.  Advised to continue using Azo to help relieve symptoms. 3.  Advised follow-up PCP or return to urgent care as needed. Final Clinical Impressions(s) / UC Diagnoses   Final diagnoses:  Dysuria  Cystitis     Discharge Instructions      Advised take the Macrobid every 12 hours until completed to treat the urinary tract infection. Advised to use the Azo as you need to help reduce pain and pressure on urination.  Advised  to increase fluid intake along with cranberry juice to try to flush the urinary system.  Advised follow-up PCP or return to urgent care as needed.    ED Prescriptions     Medication Sig Dispense Auth. Provider   nitrofurantoin, macrocrystal-monohydrate, (MACROBID) 100 MG capsule Take 1 capsule (100 mg total) by mouth 2 (two) times daily. 10 capsule Nyoka Lint, PA-C      PDMP not reviewed this encounter.   Nyoka Lint, PA-C 10/13/22 1340

## 2022-10-15 LAB — URINE CULTURE: Culture: 40000 — AB

## 2022-10-23 DIAGNOSIS — Z419 Encounter for procedure for purposes other than remedying health state, unspecified: Secondary | ICD-10-CM | POA: Diagnosis not present

## 2022-11-09 ENCOUNTER — Ambulatory Visit (INDEPENDENT_AMBULATORY_CARE_PROVIDER_SITE_OTHER): Payer: Medicaid Other | Admitting: Family

## 2022-11-09 ENCOUNTER — Encounter: Payer: Self-pay | Admitting: Family

## 2022-11-09 VITALS — BP 164/117 | HR 104 | Temp 98.0°F | Ht 65.0 in | Wt 158.4 lb

## 2022-11-09 DIAGNOSIS — I10 Essential (primary) hypertension: Secondary | ICD-10-CM

## 2022-11-09 DIAGNOSIS — F411 Generalized anxiety disorder: Secondary | ICD-10-CM | POA: Diagnosis not present

## 2022-11-09 MED ORDER — OLMESARTAN MEDOXOMIL 20 MG PO TABS: 20.0000 mg | ORAL_TABLET | Freq: Every day | ORAL | 5 refills | Status: DC

## 2022-11-09 MED ORDER — PROPRANOLOL HCL 10 MG PO TABS: 10.0000 mg | ORAL_TABLET | Freq: Three times a day (TID) | ORAL | 0 refills | Status: AC | PRN

## 2022-11-09 MED ORDER — FLUOXETINE HCL 10 MG PO CAPS: 10.0000 mg | ORAL_CAPSULE | Freq: Every day | ORAL | 1 refills | Status: DC

## 2022-11-09 NOTE — Progress Notes (Signed)
New Patient Office Visit  Subjective:  Patient ID: Kathy Guerrero, female    DOB: 19-Dec-1990  Age: 31 y.o. MRN: [id number]  CC:  Chief Complaint  Patient presents with   New Patient (Initial Visit)   Anxiety    Discuss medications    Hypertension    BP was 177/99 at ED on 10/13/2022.     HPI Kathy Guerrero presents for establishing care today.  Hypertension: Patient is currently maintained on the following medications for blood pressure: Amlodipine 5mg , Pt stopped all caffeine about a month ago, just a few sodas here & there. pt has no formal exercise.  Failed meds include: none. Patient reports good compliance with blood pressure medications. Patient denies chest pain, headaches, shortness of breath or swelling. Last 3 blood pressure readings in our office are as follows: BP Readings from Last 3 Encounters:  11/09/22 (!) 164/117  10/13/22 (!) 173/99  07/04/19 139/90   Anxiety:  pt reports having mild anxiety in her teens, denies having had therapy or meds in past, stress of being single mom, good relationship with dtr father. Not working now, wants to work remote if possible, but feels her anxiety is keeping her from finding something. States she gets anxious about being anxious and her sx to intensify. States she cut out caffeine a month ago, just occasional soda, no energy drinks. Reports having family support.   Assessment & Plan:  Generalized anxiety disorder Assessment & Plan: chronic worsened after having dtr 3 yrs ago no previous counseling or meds pt very restless, tapping foot constantly, tearful during visit sending referral for therapy, also gave # to call and schedule appt discussed med options starting Prozac 10mg  qd, advised on use & SE also staring Propranolol 10mg  tid prn, advised on use & SE reviewed other strategies to cope with stress, handout provided f/u 1 month  Orders: -     FLUoxetine HCl; Take 1 capsule (10 mg total) by mouth daily.   Dispense: 30 capsule; Refill: 1 -     Propranolol HCl; Take 1 tablet (10 mg total) by mouth 3 (three) times daily as needed (For anxiety.).  Dispense: 30 tablet; Refill: 0 -     Ambulatory referral to Psychology  Primary hypertension Assessment & Plan: chronic - since postpartum 3y ago had been on Amlodipine, but did not f/u after GYN prescribed BP high today, reports causes her anxiety to worsen, she feels flushed and nauseas starting Olmesartan 20mg  qd, advised on use & SE discussed lifestyle modifications for controlling BP including decreased caffeine intake, exercise, yoga/meditation, anxiety mgt f/u 1 month  Orders: -     Olmesartan Medoxomil; Take 1 tablet (20 mg total) by mouth daily.  Dispense: 30 tablet; Refill: 5   Subjective:    Outpatient Medications Prior to Visit  Medication Sig Dispense Refill   acetaminophen (TYLENOL) 325 MG tablet Take 2 tablets (650 mg total) by mouth every 4 (four) hours as needed (for pain scale < 4).     amLODipine (NORVASC) 5 MG tablet Take 1 tablet (5 mg total) by mouth daily. 30 tablet 1   ibuprofen (ADVIL) 600 MG tablet Take 1 tablet (600 mg total) by mouth every 6 (six) hours as needed. 30 tablet 0   nitrofurantoin, macrocrystal-monohydrate, (MACROBID) 100 MG capsule Take 1 capsule (100 mg total) by mouth 2 (two) times daily. 10 capsule 0   norethindrone (MICRONOR) 0.35 MG tablet Take 1 tablet (0.35 mg total) by mouth daily. 1 Package 11  Prenatal Vit-Fe Fumarate-FA (PRENATAL VITAMINS PO) Take 1 tablet by mouth daily.     Vitamin D, Ergocalciferol, (DRISDOL) 1.25 MG (50000 UNIT) CAPS capsule Take 50,000 Units by mouth once a week.     No facility-administered medications prior to visit.   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  Constitutional:      Appearance: Normal appearance.  Cardiovascular:     Rate and Rhythm: Normal rate and regular rhythm.  Pulmonary:     Effort: Pulmonary effort is normal.     Breath sounds: Normal breath sounds.  Musculoskeletal:        General: Normal range of motion.  Skin:    General: Skin is warm and dry.  Neurological:     Mental Status: She is alert.  Psychiatric:        Mood and Affect: Mood normal.        Behavior: Behavior normal.    Meds ordered this encounter  Medications   olmesartan (BENICAR) 20 MG tablet    Sig: Take 1 tablet (20 mg total) by mouth daily.    Dispense:  30 tablet    Refill:  5    Order Specific Question:   Supervising Provider    Answer:   ANDY, CAMILLE L [[id number]]   FLUoxetine (PROZAC) 10 MG capsule    Sig: Take 1 capsule (10 mg total) by mouth daily.    Dispense:  30 capsule    Refill:  1    Order Specific Question:   Supervising Provider    Answer:   ANDY, CAMILLE L [[id number]]   propranolol (INDERAL) 10 MG tablet    Sig: Take 1 tablet (10 mg total) by mouth 3 (three) times daily as needed (For anxiety.).    Dispense:  30 tablet    Refill:  0    Order Specific Question:   Supervising Provider    Answer:   ANDY, CAMILLE L [id number]    Jeanie Sewer, NP

## 2022-11-09 NOTE — Patient Instructions (Addendum)
Welcome to [facility] [employer], It was a pleasure meeting you today!    As discussed, I have sent your blood pressure and anxiety meds to your pharmacy. Follow the directions on the bottles. See the handout attached for ways to deal with your anxiety.  I have sent a referral to our mental health office, but you can call them as well to schedule an appointment.   Please schedule a 1 month follow up visit today.    PLEASE NOTE: If you had any LAB tests please let us know if you have not heard back within a few days. You may see your results on MyChart before we have a chance to review them but we will give you a call once they are reviewed by [place]. If we ordered any REFERRALS today, please let us know if you have not heard from their office within the next week.  Let us know through MyChart if you are needing REFILLS, or have your pharmacy send [place] the request. You can also use MyChart to communicate with me or any office staff.

## 2022-11-09 NOTE — Assessment & Plan Note (Signed)
chronic - since postpartum 3y ago had been on Amlodipine, but did not f/u after GYN prescribed BP high today, reports causes her anxiety to worsen, she feels flushed and nauseas starting Olmesartan 20mg  qd, advised on use & SE discussed lifestyle modifications for controlling BP including decreased caffeine intake, exercise, yoga/meditation, anxiety mgt f/u 1 month

## 2022-11-09 NOTE — Assessment & Plan Note (Signed)
chronic worsened after having dtr 3 yrs ago no previous counseling or meds pt very restless, tapping foot constantly, tearful during visit sending referral for therapy, also gave # to call and schedule appt discussed med options starting Prozac 10mg  qd, advised on use & SE also staring Propranolol 10mg  tid prn, advised on use & SE reviewed other strategies to cope with stress, handout provided f/u 1 month

## 2022-11-23 DIAGNOSIS — Z419 Encounter for procedure for purposes other than remedying health state, unspecified: Secondary | ICD-10-CM | POA: Diagnosis not present

## 2022-12-23 DIAGNOSIS — Z419 Encounter for procedure for purposes other than remedying health state, unspecified: Secondary | ICD-10-CM | POA: Diagnosis not present

## 2023-01-06 ENCOUNTER — Other Ambulatory Visit: Payer: Self-pay | Admitting: Family

## 2023-01-06 DIAGNOSIS — F411 Generalized anxiety disorder: Secondary | ICD-10-CM

## 2023-01-23 DIAGNOSIS — Z419 Encounter for procedure for purposes other than remedying health state, unspecified: Secondary | ICD-10-CM | POA: Diagnosis not present

## 2023-02-22 DIAGNOSIS — Z419 Encounter for procedure for purposes other than remedying health state, unspecified: Secondary | ICD-10-CM | POA: Diagnosis not present

## 2023-03-17 ENCOUNTER — Ambulatory Visit
Admission: RE | Admit: 2023-03-17 | Discharge: 2023-03-17 | Disposition: A | Discharge status: Home / Self Care | Payer: Medicaid Other | Source: Ambulatory Visit | Attending: Physician Assistant | Admitting: Physician Assistant

## 2023-03-17 ENCOUNTER — Telehealth: Payer: Self-pay | Admitting: *Deleted

## 2023-03-17 VITALS — BP 166/112 | HR 90 | Temp 97.7°F | Resp 18 | Ht 65.0 in | Wt 145.0 lb

## 2023-03-17 DIAGNOSIS — N3000 Acute cystitis without hematuria: Secondary | ICD-10-CM

## 2023-03-17 DIAGNOSIS — I10 Essential (primary) hypertension: Secondary | ICD-10-CM

## 2023-03-17 LAB — POCT URINALYSIS DIP (MANUAL ENTRY)
Bilirubin, UA: NEGATIVE
Blood, UA: NEGATIVE
Glucose, UA: NEGATIVE mg/dL
Ketones, POC UA: NEGATIVE mg/dL
Nitrite, UA: NEGATIVE
Protein Ur, POC: NEGATIVE mg/dL
Spec Grav, UA: 1.01 (ref 1.010–1.025)
Urobilinogen, UA: 0.2 E.U./dL
pH, UA: 6 (ref 5.0–8.0)

## 2023-03-17 LAB — POCT URINE PREGNANCY: Preg Test, Ur: NEGATIVE

## 2023-03-17 MED ORDER — NITROFURANTOIN MONOHYD MACRO 100 MG PO CAPS: 100.0000 mg | ORAL_CAPSULE | Freq: Two times a day (BID) | ORAL | 0 refills | Status: DC

## 2023-03-17 MED ORDER — NITROFURANTOIN MONOHYD MACRO 100 MG PO CAPS: 100.0000 mg | ORAL_CAPSULE | Freq: Two times a day (BID) | ORAL | 0 refills | Status: AC

## 2023-03-17 NOTE — ED Triage Notes (Signed)
Bladder infection possible uti - Entered by patient  "I have been having UTI's & I think I have another one". In the morning's "odor with a lot of urination continuing". No fever. "X3-4 in last 12 mos".

## 2023-03-17 NOTE — ED Provider Notes (Signed)
[facility]    CSN: [id number] Arrival date & time: 03/17/23  1247      History   Chief Complaint Chief Complaint  Patient presents with   Urinary Frequency    HPI Kathy Guerrero is a 32 y.o. female.   Patient here today for evaluation of mild dysuria that started few days ago.  She states she is also having some odor with her urinating in the morning.  She has history of UTIs and has had similar symptoms in the beginning of same.  She notes she has had 3-4 UTIs in the last year.  She denies any vomiting or abdominal pain.  She has not any back pain.  She does not report treatment for symptoms.  Blood pressure is elevated in office but this is not abnormal for patient.  She denies any headaches, chest pain, shortness of breath.  She states she is working with her PCP for treatment of hypertension.  The history is provided by the patient.  Urinary Frequency Pertinent negatives include no chest pain, no abdominal pain, no headaches and no shortness of breath.    Past Medical History:  Diagnosis Date   History of anxiety 03/15/2019   Postpartum hypertension 07/04/2019   Seizures (HCC)    as a child, none since then   UTI (urinary tract infection)     Patient Active Problem List   Diagnosis Date Noted   Primary hypertension 11/09/2022   Generalized anxiety disorder 11/09/2022   Vitamin D deficiency 02/20/2019    Past Surgical History:  Procedure Laterality Date   NO PAST SURGERIES     WISDOM TOOTH EXTRACTION      OB History     Gravida  3   Para  1   Term  1   Preterm      AB  2   Living  1      SAB      IAB  1   Ectopic      Multiple  0   Live Births  1            Home Medications    Prior to Admission medications   Medication Sig Start Date End Date Taking? Authorizing Provider  FLUoxetine (PROZAC) 10 MG capsule TAKE 1 CAPSULE BY MOUTH EVERY DAY 01/06/23   Dulce Sellar, NP  nitrofurantoin,  macrocrystal-monohydrate, (MACROBID) 100 MG capsule Take 1 capsule (100 mg total) by mouth 2 (two) times daily for 7 days. 03/17/23 03/24/23  Merrilee Jansky, MD  olmesartan (BENICAR) 20 MG tablet Take 1 tablet (20 mg total) by mouth daily. 11/09/22   Dulce Sellar, NP  propranolol (INDERAL) 10 MG tablet Take 1 tablet (10 mg total) by mouth 3 (three) times daily as needed (For anxiety.). 11/09/22   Dulce Sellar, NP    Family History Family History  Problem Relation Age of Onset   Hyperlipidemia Mother    Asthma Mother    Hyperlipidemia Father    Hypertension Father    Kidney disease Brother    Hyperlipidemia Maternal Grandmother    Cancer Maternal Grandmother    Arthritis Maternal Grandmother    Kidney disease Maternal Grandfather    Hypertension Maternal Grandfather    Hyperlipidemia Maternal Grandfather    Heart disease Maternal Grandfather    Diabetes Maternal Grandfather    Depression Maternal Grandfather    Depression Paternal Grandmother    Cancer Paternal Grandmother    Alcohol abuse Paternal Grandmother    Kidney  disease Paternal Grandmother    Hypertension Paternal Grandmother    Alcohol abuse Paternal Grandfather    Diabetes Paternal Grandfather    Heart disease Paternal Grandfather    Hyperlipidemia Paternal Grandfather     Social History Social History   Tobacco Use   Smoking status: Former    Current packs/day: 0.00    Types: Cigarettes    Quit date: 01/23/2019    Years since quitting: 4.1   Smokeless tobacco: Never  Vaping Use   Vaping status: Every Day   Substances: Nicotine, Flavoring  Substance Use Topics   Alcohol use: Yes    Alcohol/week: 1.0 standard drink of alcohol    Types: 1 Glasses of wine per week    Comment: Occassionally.   Drug use: Yes    Types: Marijuana     Allergies   Dilantin [phenytoin]   Review of Systems Review of Systems  Constitutional:  Negative for chills and fever.  Eyes:  Negative for discharge and  redness.  Respiratory:  Negative for shortness of breath.   Cardiovascular:  Negative for chest pain.  Gastrointestinal:  Negative for abdominal pain, nausea and vomiting.  Genitourinary:  Positive for dysuria and frequency.  Neurological:  Negative for headaches.     Physical Exam Triage Vital Signs ED Triage Vitals  Encounter Vitals Group     BP 03/17/23 1257 (!) 165/109     Systolic BP Percentile --      Diastolic BP Percentile --      Pulse Rate 03/17/23 1257 85     Resp 03/17/23 1257 18     Temp 03/17/23 1257 97.7 F (36.5 C)     Temp Source 03/17/23 1257 Oral     SpO2 03/17/23 1257 99 %     Weight 03/17/23 1254 145 lb (65.8 kg)     Height 03/17/23 1254 5\' 5"  (1.651 m)     Head Circumference --      Peak Flow --      Pain Score 03/17/23 1254 0     Pain Loc --      Pain Education --      Exclude from Growth Chart --    No data found.  Updated Vital Signs BP (!) 166/112 (BP Location: Left Arm)   Pulse 90   Temp 97.7 F (36.5 C) (Oral)   Resp 18   Ht 5\' 5"  (1.651 m)   Wt 145 lb (65.8 kg)   LMP 03/05/2023 (Exact Date)   SpO2 99%   BMI 24.13 kg/m      Physical Exam Vitals and nursing note reviewed.  Constitutional:      General: She is not in acute distress.    Appearance: Normal appearance. She is not ill-appearing.  HENT:     Head: Normocephalic and atraumatic.  Eyes:     Conjunctiva/sclera: Conjunctivae normal.  Cardiovascular:     Rate and Rhythm: Normal rate and regular rhythm.     Heart sounds: No murmur heard. Pulmonary:     Effort: Pulmonary effort is normal. No respiratory distress.     Breath sounds: No wheezing, rhonchi or rales.  Neurological:     Mental Status: She is alert.  Psychiatric:        Mood and Affect: Mood normal.        Behavior: Behavior normal.        Thought Content: Thought content normal.      UC Treatments / Results  Labs (all labs ordered are listed,  but only abnormal results are displayed) Labs Reviewed  POCT  URINALYSIS DIP (MANUAL ENTRY) - Abnormal; Notable for the following components:      Result Value   Leukocytes, UA Trace (*)    All other components within normal limits  URINE CULTURE  POCT URINE PREGNANCY    EKG   Radiology No results found.  Procedures Procedures (including critical care time)  Medications Ordered in UC Medications - No data to display  Initial Impression / Assessment and Plan / UC Course  I have reviewed the triage vital signs and the nursing notes.  Pertinent labs & imaging results that were available during my care of the patient were reviewed by me and considered in my medical decision making (see chart for details).    Will treat to cover UTI and urine culture ordered. Patient denies concerns for STD. She has not signs or symptoms of end organ damage related to HTN- recommended she follow up with PCP regarding same. Encouraged sooner follow up with any other symptoms or concerns.   Final Clinical Impressions(s) / UC Diagnoses   Final diagnoses:  Acute cystitis without hematuria  Primary hypertension   Discharge Instructions   None    ED Prescriptions     Medication Sig Dispense Auth. Provider   nitrofurantoin, macrocrystal-monohydrate, (MACROBID) 100 MG capsule Take 1 capsule (100 mg total) by mouth 2 (two) times daily for 7 days. 14 capsule Tomi Bamberger, PA-C      PDMP not reviewed this encounter.   Tomi Bamberger, PA-C 03/17/23 1800

## 2023-03-25 DIAGNOSIS — Z419 Encounter for procedure for purposes other than remedying health state, unspecified: Secondary | ICD-10-CM | POA: Diagnosis not present

## 2023-04-25 DIAGNOSIS — Z419 Encounter for procedure for purposes other than remedying health state, unspecified: Secondary | ICD-10-CM | POA: Diagnosis not present

## 2023-05-25 DIAGNOSIS — Z419 Encounter for procedure for purposes other than remedying health state, unspecified: Secondary | ICD-10-CM | POA: Diagnosis not present

## 2023-06-25 DIAGNOSIS — Z419 Encounter for procedure for purposes other than remedying health state, unspecified: Secondary | ICD-10-CM | POA: Diagnosis not present

## 2023-07-25 DIAGNOSIS — Z419 Encounter for procedure for purposes other than remedying health state, unspecified: Secondary | ICD-10-CM | POA: Diagnosis not present

## 2023-08-25 DIAGNOSIS — Z419 Encounter for procedure for purposes other than remedying health state, unspecified: Secondary | ICD-10-CM | POA: Diagnosis not present

## 2023-09-25 DIAGNOSIS — Z419 Encounter for procedure for purposes other than remedying health state, unspecified: Secondary | ICD-10-CM | POA: Diagnosis not present

## 2023-10-23 DIAGNOSIS — Z419 Encounter for procedure for purposes other than remedying health state, unspecified: Secondary | ICD-10-CM | POA: Diagnosis not present

## 2023-11-03 DIAGNOSIS — Z419 Encounter for procedure for purposes other than remedying health state, unspecified: Secondary | ICD-10-CM | POA: Diagnosis not present

## 2023-12-04 DIAGNOSIS — Z419 Encounter for procedure for purposes other than remedying health state, unspecified: Secondary | ICD-10-CM | POA: Diagnosis not present

## 2023-12-13 DIAGNOSIS — Z743 Need for continuous supervision: Secondary | ICD-10-CM | POA: Diagnosis not present

## 2023-12-13 DIAGNOSIS — F411 Generalized anxiety disorder: Secondary | ICD-10-CM | POA: Diagnosis not present

## 2023-12-13 DIAGNOSIS — R110 Nausea: Secondary | ICD-10-CM | POA: Diagnosis not present

## 2023-12-13 DIAGNOSIS — E871 Hypo-osmolality and hyponatremia: Secondary | ICD-10-CM | POA: Diagnosis not present

## 2023-12-13 DIAGNOSIS — R45851 Suicidal ideations: Secondary | ICD-10-CM | POA: Diagnosis not present

## 2023-12-13 DIAGNOSIS — F17200 Nicotine dependence, unspecified, uncomplicated: Secondary | ICD-10-CM | POA: Diagnosis not present

## 2023-12-13 DIAGNOSIS — F1920 Other psychoactive substance dependence, uncomplicated: Secondary | ICD-10-CM | POA: Diagnosis not present

## 2023-12-13 DIAGNOSIS — E860 Dehydration: Secondary | ICD-10-CM | POA: Diagnosis not present

## 2023-12-13 DIAGNOSIS — B1920 Unspecified viral hepatitis C without hepatic coma: Secondary | ICD-10-CM | POA: Diagnosis not present

## 2023-12-13 DIAGNOSIS — E8729 Other acidosis: Secondary | ICD-10-CM | POA: Diagnosis not present

## 2023-12-13 DIAGNOSIS — F1220 Cannabis dependence, uncomplicated: Secondary | ICD-10-CM | POA: Diagnosis not present

## 2023-12-13 DIAGNOSIS — D6959 Other secondary thrombocytopenia: Secondary | ICD-10-CM | POA: Diagnosis not present

## 2023-12-13 DIAGNOSIS — F10239 Alcohol dependence with withdrawal, unspecified: Secondary | ICD-10-CM | POA: Diagnosis not present

## 2023-12-13 DIAGNOSIS — I10 Essential (primary) hypertension: Secondary | ICD-10-CM | POA: Diagnosis not present

## 2023-12-13 DIAGNOSIS — E872 Acidosis, unspecified: Secondary | ICD-10-CM | POA: Diagnosis not present

## 2023-12-13 DIAGNOSIS — F600 Paranoid personality disorder: Secondary | ICD-10-CM | POA: Diagnosis not present

## 2023-12-14 DIAGNOSIS — R1110 Vomiting, unspecified: Secondary | ICD-10-CM | POA: Diagnosis not present

## 2023-12-15 DIAGNOSIS — E872 Acidosis, unspecified: Secondary | ICD-10-CM | POA: Diagnosis not present

## 2023-12-15 DIAGNOSIS — E876 Hypokalemia: Secondary | ICD-10-CM | POA: Diagnosis not present

## 2023-12-16 DIAGNOSIS — F32A Depression, unspecified: Secondary | ICD-10-CM | POA: Diagnosis not present

## 2023-12-16 DIAGNOSIS — F10930 Alcohol use, unspecified with withdrawal, uncomplicated: Secondary | ICD-10-CM | POA: Diagnosis not present

## 2023-12-17 DIAGNOSIS — F10930 Alcohol use, unspecified with withdrawal, uncomplicated: Secondary | ICD-10-CM | POA: Diagnosis not present

## 2023-12-18 DIAGNOSIS — F32A Depression, unspecified: Secondary | ICD-10-CM | POA: Diagnosis not present

## 2023-12-19 DIAGNOSIS — F32A Depression, unspecified: Secondary | ICD-10-CM | POA: Diagnosis not present

## 2023-12-20 DIAGNOSIS — F411 Generalized anxiety disorder: Secondary | ICD-10-CM | POA: Diagnosis not present

## 2023-12-21 DIAGNOSIS — F411 Generalized anxiety disorder: Secondary | ICD-10-CM | POA: Diagnosis not present

## 2023-12-21 DIAGNOSIS — R7401 Elevation of levels of liver transaminase levels: Secondary | ICD-10-CM | POA: Diagnosis not present

## 2023-12-21 DIAGNOSIS — K820 Obstruction of gallbladder: Secondary | ICD-10-CM | POA: Diagnosis not present

## 2023-12-22 DIAGNOSIS — R748 Abnormal levels of other serum enzymes: Secondary | ICD-10-CM | POA: Diagnosis not present

## 2023-12-22 DIAGNOSIS — R7401 Elevation of levels of liver transaminase levels: Secondary | ICD-10-CM | POA: Diagnosis not present

## 2023-12-23 ENCOUNTER — Inpatient Hospital Stay: Admitting: Family

## 2024-01-03 DIAGNOSIS — Z419 Encounter for procedure for purposes other than remedying health state, unspecified: Secondary | ICD-10-CM | POA: Diagnosis not present

## 2024-01-05 ENCOUNTER — Emergency Department (HOSPITAL_COMMUNITY)
Admission: EM | Admit: 2024-01-05 | Discharge: 2024-01-06 | Disposition: A | Discharge status: Other Acute Inpatient Hospital | Attending: Emergency Medicine | Admitting: Emergency Medicine

## 2024-01-05 DIAGNOSIS — Z638 Other specified problems related to primary support group: Secondary | ICD-10-CM | POA: Diagnosis not present

## 2024-01-05 DIAGNOSIS — R454 Irritability and anger: Secondary | ICD-10-CM | POA: Diagnosis not present

## 2024-01-05 DIAGNOSIS — R451 Restlessness and agitation: Secondary | ICD-10-CM | POA: Diagnosis not present

## 2024-01-05 DIAGNOSIS — F10921 Alcohol use, unspecified with intoxication delirium: Secondary | ICD-10-CM

## 2024-01-05 DIAGNOSIS — Z79899 Other long term (current) drug therapy: Secondary | ICD-10-CM | POA: Diagnosis not present

## 2024-01-05 DIAGNOSIS — F10129 Alcohol abuse with intoxication, unspecified: Secondary | ICD-10-CM | POA: Diagnosis not present

## 2024-01-05 DIAGNOSIS — R45850 Homicidal ideations: Secondary | ICD-10-CM | POA: Diagnosis not present

## 2024-01-05 DIAGNOSIS — F411 Generalized anxiety disorder: Secondary | ICD-10-CM | POA: Diagnosis present

## 2024-01-05 DIAGNOSIS — Y908 Blood alcohol level of 240 mg/100 ml or more: Secondary | ICD-10-CM | POA: Insufficient documentation

## 2024-01-05 DIAGNOSIS — F329 Major depressive disorder, single episode, unspecified: Secondary | ICD-10-CM

## 2024-01-05 DIAGNOSIS — F1021 Alcohol dependence, in remission: Secondary | ICD-10-CM | POA: Diagnosis present

## 2024-01-05 DIAGNOSIS — I10 Essential (primary) hypertension: Secondary | ICD-10-CM | POA: Diagnosis not present

## 2024-01-05 DIAGNOSIS — F32A Depression, unspecified: Secondary | ICD-10-CM | POA: Diagnosis not present

## 2024-01-05 DIAGNOSIS — F10121 Alcohol abuse with intoxication delirium: Secondary | ICD-10-CM | POA: Diagnosis not present

## 2024-01-05 DIAGNOSIS — F1210 Cannabis abuse, uncomplicated: Secondary | ICD-10-CM | POA: Insufficient documentation

## 2024-01-05 DIAGNOSIS — R443 Hallucinations, unspecified: Secondary | ICD-10-CM | POA: Insufficient documentation

## 2024-01-05 DIAGNOSIS — F17290 Nicotine dependence, other tobacco product, uncomplicated: Secondary | ICD-10-CM | POA: Diagnosis not present

## 2024-01-05 DIAGNOSIS — F1290 Cannabis use, unspecified, uncomplicated: Secondary | ICD-10-CM | POA: Diagnosis not present

## 2024-01-05 DIAGNOSIS — F10229 Alcohol dependence with intoxication, unspecified: Secondary | ICD-10-CM | POA: Diagnosis not present

## 2024-01-05 DIAGNOSIS — Z560 Unemployment, unspecified: Secondary | ICD-10-CM | POA: Diagnosis not present

## 2024-01-06 ENCOUNTER — Ambulatory Visit (HOSPITAL_COMMUNITY)
Admission: EM | Admit: 2024-01-06 | Discharge: 2024-01-07 | Disposition: A | Discharge status: Home / Self Care | Source: Home / Self Care

## 2024-01-06 ENCOUNTER — Encounter (HOSPITAL_COMMUNITY): Payer: Self-pay

## 2024-01-06 ENCOUNTER — Other Ambulatory Visit: Payer: Self-pay

## 2024-01-06 ENCOUNTER — Other Ambulatory Visit (HOSPITAL_COMMUNITY)
Admission: EM | Admit: 2024-01-06 | Discharge: 2024-01-06 | Disposition: A | Discharge status: Other Acute Inpatient Hospital | Source: Home / Self Care

## 2024-01-06 DIAGNOSIS — F411 Generalized anxiety disorder: Secondary | ICD-10-CM | POA: Insufficient documentation

## 2024-01-06 DIAGNOSIS — F1090 Alcohol use, unspecified, uncomplicated: Secondary | ICD-10-CM

## 2024-01-06 DIAGNOSIS — I10 Essential (primary) hypertension: Secondary | ICD-10-CM | POA: Insufficient documentation

## 2024-01-06 DIAGNOSIS — Z638 Other specified problems related to primary support group: Secondary | ICD-10-CM

## 2024-01-06 DIAGNOSIS — F32A Depression, unspecified: Secondary | ICD-10-CM | POA: Insufficient documentation

## 2024-01-06 DIAGNOSIS — Z560 Unemployment, unspecified: Secondary | ICD-10-CM | POA: Insufficient documentation

## 2024-01-06 DIAGNOSIS — F329 Major depressive disorder, single episode, unspecified: Secondary | ICD-10-CM | POA: Diagnosis not present

## 2024-01-06 DIAGNOSIS — F10129 Alcohol abuse with intoxication, unspecified: Secondary | ICD-10-CM | POA: Insufficient documentation

## 2024-01-06 DIAGNOSIS — Z79899 Other long term (current) drug therapy: Secondary | ICD-10-CM | POA: Insufficient documentation

## 2024-01-06 DIAGNOSIS — F10229 Alcohol dependence with intoxication, unspecified: Secondary | ICD-10-CM | POA: Insufficient documentation

## 2024-01-06 DIAGNOSIS — F17290 Nicotine dependence, other tobacco product, uncomplicated: Secondary | ICD-10-CM | POA: Insufficient documentation

## 2024-01-06 DIAGNOSIS — F1910 Other psychoactive substance abuse, uncomplicated: Secondary | ICD-10-CM

## 2024-01-06 DIAGNOSIS — F1290 Cannabis use, unspecified, uncomplicated: Secondary | ICD-10-CM | POA: Insufficient documentation

## 2024-01-06 LAB — COMPREHENSIVE METABOLIC PANEL WITH GFR
ALT: 48 U/L — ABNORMAL HIGH (ref 0–44)
AST: 33 U/L (ref 15–41)
Albumin: 4.2 g/dL (ref 3.5–5.0)
Alkaline Phosphatase: 59 U/L (ref 38–126)
Anion gap: 10 (ref 5–15)
BUN: <5 mg/dL — ABNORMAL LOW (ref 6–20)
CO2: 20 mmol/L — ABNORMAL LOW (ref 22–32)
Calcium: 9.6 mg/dL (ref 8.9–10.3)
Chloride: 107 mmol/L (ref 98–111)
Creatinine, Ser: 0.46 mg/dL (ref 0.44–1.00)
GFR, Estimated: >60 mL/min (ref >60)
Glucose, Bld: 116 mg/dL — ABNORMAL HIGH (ref 70–99)
Potassium: 4 mmol/L (ref 3.5–5.1)
Sodium: 137 mmol/L (ref 135–145)
Total Bilirubin: 0.9 mg/dL (ref 0.0–1.2)
Total Protein: 7.6 g/dL (ref 6.5–8.1)

## 2024-01-06 LAB — ETHANOL: Alcohol, Ethyl (B): 297 mg/dL — ABNORMAL HIGH (ref <15)

## 2024-01-06 LAB — CBC WITH DIFFERENTIAL/PLATELET
Abs Immature Granulocytes: 0.04 10*3/uL (ref 0.00–0.07)
Basophils Absolute: 0 10*3/uL (ref 0.0–0.1)
Basophils Relative: 0 %
Eosinophils Absolute: 0.1 10*3/uL (ref 0.0–0.5)
Eosinophils Relative: 1 %
HCT: 43.3 % (ref 36.0–46.0)
Hemoglobin: 15 g/dL (ref 12.0–15.0)
Immature Granulocytes: 1 %
Lymphocytes Relative: 28 %
Lymphs Abs: 2 10*3/uL (ref 0.7–4.0)
MCH: 32.9 pg (ref 26.0–34.0)
MCHC: 34.6 g/dL (ref 30.0–36.0)
MCV: 95 fL (ref 80.0–100.0)
Monocytes Absolute: 0.6 10*3/uL (ref 0.1–1.0)
Monocytes Relative: 9 %
Neutro Abs: 4.5 10*3/uL (ref 1.7–7.7)
Neutrophils Relative %: 61 %
Platelets: 362 10*3/uL (ref 150–400)
RBC: 4.56 MIL/uL (ref 3.87–5.11)
RDW: 11.5 % (ref 11.5–15.5)
WBC: 7.3 10*3/uL (ref 4.0–10.5)
nRBC: 0 % (ref 0.0–0.2)

## 2024-01-06 LAB — RAPID URINE DRUG SCREEN, HOSP PERFORMED
Amphetamines: NOT DETECTED
Barbiturates: NOT DETECTED
Benzodiazepines: NOT DETECTED
Cocaine: NOT DETECTED
Opiates: NOT DETECTED
Tetrahydrocannabinol: POSITIVE — AB

## 2024-01-06 LAB — HCG, SERUM, QUALITATIVE: Preg, Serum: NEGATIVE

## 2024-01-06 MED ORDER — LORAZEPAM 1 MG PO TABS: 1.0000 mg | ORAL_TABLET | Freq: Two times a day (BID) | ORAL | Status: DC

## 2024-01-06 MED ORDER — ALUM & MAG HYDROXIDE-SIMETH 200-200-20 MG/5ML PO SUSP: 30.0000 mL | ORAL | Status: DC | PRN

## 2024-01-06 MED ORDER — FLUOXETINE HCL 10 MG PO CAPS: 10.0000 mg | ORAL_CAPSULE | Freq: Every day | ORAL | Status: DC

## 2024-01-06 MED ORDER — TRAZODONE HCL 50 MG PO TABS
50.0000 mg | ORAL_TABLET | Freq: Every evening | ORAL | Status: DC | PRN
  Administered 2024-01-06: 50 mg via ORAL
  Filled 2024-01-06: qty 1

## 2024-01-06 MED ORDER — DIPHENHYDRAMINE HCL 50 MG/ML IJ SOLN
50.0000 mg | Freq: Three times a day (TID) | INTRAMUSCULAR | Status: DC | PRN
Start: 2024-01-06 — End: 2024-01-06

## 2024-01-06 MED ORDER — DIPHENHYDRAMINE HCL 50 MG/ML IJ SOLN: 50.0000 mg | Freq: Three times a day (TID) | INTRAMUSCULAR | Status: DC | PRN

## 2024-01-06 MED ORDER — HALOPERIDOL 5 MG PO TABS: 5.0000 mg | ORAL_TABLET | Freq: Three times a day (TID) | ORAL | Status: DC | PRN

## 2024-01-06 MED ORDER — BENZTROPINE MESYLATE 0.5 MG PO TABS: 1.0000 mg | ORAL_TABLET | Freq: Four times a day (QID) | ORAL | Status: DC | PRN

## 2024-01-06 MED ORDER — HALOPERIDOL LACTATE 5 MG/ML IJ SOLN: 10.0000 mg | Freq: Three times a day (TID) | INTRAMUSCULAR | Status: DC | PRN

## 2024-01-06 MED ORDER — NICOTINE 21 MG/24HR TD PT24: 21.0000 mg | MEDICATED_PATCH | Freq: Every day | TRANSDERMAL | Status: DC | PRN

## 2024-01-06 MED ORDER — FLUOXETINE HCL 10 MG PO CAPS
10.0000 mg | ORAL_CAPSULE | Freq: Every day | ORAL | Status: DC
  Administered 2024-01-06: 10 mg via ORAL
  Filled 2024-01-06: qty 1

## 2024-01-06 MED ORDER — THIAMINE MONONITRATE 100 MG PO TABS
100.0000 mg | ORAL_TABLET | Freq: Every day | ORAL | Status: DC
  Administered 2024-01-06: 100 mg via ORAL
  Filled 2024-01-06: qty 1

## 2024-01-06 MED ORDER — LORAZEPAM 2 MG/ML IJ SOLN: 2.0000 mg | Freq: Three times a day (TID) | INTRAMUSCULAR | Status: DC | PRN

## 2024-01-06 MED ORDER — CHLORDIAZEPOXIDE HCL 25 MG PO CAPS: 25.0000 mg | ORAL_CAPSULE | Freq: Four times a day (QID) | ORAL | Status: DC | PRN

## 2024-01-06 MED ORDER — MAGNESIUM HYDROXIDE 400 MG/5ML PO SUSP: 30.0000 mL | Freq: Every day | ORAL | Status: DC | PRN

## 2024-01-06 MED ORDER — STERILE WATER FOR INJECTION IJ SOLN
INTRAMUSCULAR | Status: AC
Start: 2024-01-06 — End: 2024-01-06
  Administered 2024-01-06: 1.2 mL via INTRAMUSCULAR
  Filled 2024-01-06: qty 10

## 2024-01-06 MED ORDER — THIAMINE HCL 100 MG/ML IJ SOLN: 100.0000 mg | Freq: Once | INTRAMUSCULAR | Status: DC

## 2024-01-06 MED ORDER — LORAZEPAM 2 MG/ML IJ SOLN: 2.0000 mg | Freq: Four times a day (QID) | INTRAMUSCULAR | Status: DC | PRN

## 2024-01-06 MED ORDER — DIPHENHYDRAMINE HCL 50 MG PO CAPS: 50.0000 mg | ORAL_CAPSULE | Freq: Three times a day (TID) | ORAL | Status: DC | PRN

## 2024-01-06 MED ORDER — HYDROXYZINE HCL 25 MG PO TABS: 25.0000 mg | ORAL_TABLET | Freq: Four times a day (QID) | ORAL | Status: DC | PRN

## 2024-01-06 MED ORDER — ACETAMINOPHEN 325 MG PO TABS: 650.0000 mg | ORAL_TABLET | Freq: Four times a day (QID) | ORAL | Status: DC | PRN

## 2024-01-06 MED ORDER — PROPRANOLOL HCL 20 MG PO TABS: 10.0000 mg | ORAL_TABLET | Freq: Three times a day (TID) | ORAL | Status: DC | PRN

## 2024-01-06 MED ORDER — HALOPERIDOL LACTATE 5 MG/ML IJ SOLN: 5.0000 mg | Freq: Three times a day (TID) | INTRAMUSCULAR | Status: DC | PRN

## 2024-01-06 MED ORDER — HALOPERIDOL LACTATE 5 MG/ML IJ SOLN: 5.0000 mg | Freq: Four times a day (QID) | INTRAMUSCULAR | Status: DC | PRN

## 2024-01-06 MED ORDER — CLONIDINE HCL 0.1 MG PO TABS: 0.1000 mg | ORAL_TABLET | Freq: Once | ORAL | Status: DC | PRN

## 2024-01-06 MED ORDER — ONDANSETRON 4 MG PO TBDP: 4.0000 mg | ORAL_TABLET | Freq: Four times a day (QID) | ORAL | Status: DC | PRN

## 2024-01-06 MED ORDER — ADULT MULTIVITAMIN W/MINERALS CH: 1.0000 | ORAL_TABLET | Freq: Every day | ORAL | Status: DC

## 2024-01-06 MED ORDER — LOPERAMIDE HCL 2 MG PO CAPS
2.0000 mg | ORAL_CAPSULE | ORAL | Status: DC | PRN
Start: 2024-01-06 — End: 2024-01-06

## 2024-01-06 MED ORDER — ONDANSETRON HCL 4 MG PO TABS: 4.0000 mg | ORAL_TABLET | Freq: Three times a day (TID) | ORAL | Status: DC | PRN

## 2024-01-06 MED ORDER — LORAZEPAM 1 MG PO TABS: 0.0000 mg | ORAL_TABLET | Freq: Two times a day (BID) | ORAL | Status: DC

## 2024-01-06 MED ORDER — LORAZEPAM 2 MG/ML IJ SOLN: 0.0000 mg | Freq: Four times a day (QID) | INTRAMUSCULAR | Status: DC

## 2024-01-06 MED ORDER — HALOPERIDOL LACTATE 5 MG/ML IJ SOLN
10.0000 mg | Freq: Three times a day (TID) | INTRAMUSCULAR | Status: DC | PRN
Start: 2024-01-06 — End: 2024-01-07

## 2024-01-06 MED ORDER — ZIPRASIDONE MESYLATE 20 MG IM SOLR
20.0000 mg | Freq: Once | INTRAMUSCULAR | Status: AC
  Administered 2024-01-06: 20 mg via INTRAMUSCULAR
  Filled 2024-01-06: qty 20

## 2024-01-06 MED ORDER — NICOTINE 21 MG/24HR TD PT24: 21.0000 mg | MEDICATED_PATCH | Freq: Every day | TRANSDERMAL | Status: DC

## 2024-01-06 MED ORDER — LOPERAMIDE HCL 2 MG PO CAPS: 2.0000 mg | ORAL_CAPSULE | ORAL | Status: DC | PRN

## 2024-01-06 MED ORDER — HALOPERIDOL 5 MG PO TABS: 5.0000 mg | ORAL_TABLET | Freq: Four times a day (QID) | ORAL | Status: DC | PRN

## 2024-01-06 MED ORDER — NICOTINE POLACRILEX 2 MG MT GUM: 2.0000 mg | CHEWING_GUM | OROMUCOSAL | Status: DC | PRN

## 2024-01-06 MED ORDER — IBUPROFEN 200 MG PO TABS: 600.0000 mg | ORAL_TABLET | Freq: Three times a day (TID) | ORAL | Status: DC | PRN

## 2024-01-06 MED ORDER — LORAZEPAM 1 MG PO TABS
0.0000 mg | ORAL_TABLET | Freq: Four times a day (QID) | ORAL | Status: DC
  Administered 2024-01-06: 1 mg via ORAL
  Filled 2024-01-06: qty 1

## 2024-01-06 MED ORDER — CLONIDINE HCL 0.1 MG PO TABS
0.1000 mg | ORAL_TABLET | Freq: Once | ORAL | Status: AC
  Administered 2024-01-06: 0.1 mg via ORAL
  Filled 2024-01-06: qty 1

## 2024-01-06 MED ORDER — MIRTAZAPINE 7.5 MG PO TABS: 15.0000 mg | ORAL_TABLET | Freq: Every day | ORAL | Status: DC

## 2024-01-06 MED ORDER — THIAMINE HCL 100 MG/ML IJ SOLN: 100.0000 mg | Freq: Every day | INTRAMUSCULAR | Status: DC

## 2024-01-06 NOTE — ED Provider Notes (Signed)
 [facility] Continuous Assessment Admission H&P  Date: 01/06/24 Patient Name: Kathy Guerrero MRN: [id number] Chief Complaint: " Last night I was just celebrating"  Diagnoses:  Final diagnoses:  Alcohol abuse with intoxication [facility])  Family discord    HPI: Kathy Guerrero "[name]" is a 33 year old female with psychiatric history of Anxiety, GAD, MDD and polysubstance abuse, who presented voluntarily to [facility] under IVC as a direct transfer from [facility] with recommendation to the [facility] for alcohol detox.  Patient was seen face to face by this provider and chart reviewed.  Per IVC petition " Respondent has been diagnosed with chemical imbalance, depression, anxiety, alcoholism; respondent has been prescribed medications; respondent was committed a few weeks ago to [facility]; respondent drinks alcohol daily; respondent appears to be hallucinating and hearing voices; respondent broke the front door glass this evening, respondent is running through the neighborhood threatening to kill the neighbors and her family; respondent is a danger to self; respondent is a danger to others.  Patient denies the report in the IVC petition and states she went to [facility] last night after a family feud "because it was better for me not to be there, I just didn't want an argument again,especially arguments with my dad and sometimes it gets loud, I live with my mom and dad and the dog, I feel fine going back home tomorrow, I want to move out as soon as possible".   On approach,patient is calm and cooperative and reports " Last night I was just celebrating, I was drinking a lot of wine and partying before the argument with my parents, but before then, I hadn't drank that much in about a month. In the past, I used to drink a lot of wine when I was in college, about a bottle and regularly when I got out. But since I had my daughter, I don't like drinking around her. I only drink when I party".   Patient  also endorsed use of CBD gummies at night for sleep. She denies other illicit substance use.   She is not established with outpatient psychiatry for medication management or therapy. She denies alcohol dependence. She reports feeling safe returning home in the am and would like to be discharged in the am.   On evaluation, patient is alert, oriented x 4, and cooperative. Speech is clear, normal rate and coherent.  Pt appears dressed in scrubs. Eye contact is good. Mood is euthymic, affect is congruent with mood. Thought process is coherent and thought content is WDL. Pt denies SI/HI/AVH. There is no objective indication that the patient is responding to internal stimuli. No delusions elicited during this assessment.    Discussed recommendation for admission to the continuous observation unit for safety monitoring and re-eval in the am.  Patient is provided with opportunity for questions. She verbalized understanding and is in agreement.   Total Time spent with patient: 30 minutes  Musculoskeletal  Strength & Muscle Tone: within normal limits Gait & Station: normal Patient leans: N/A  Psychiatric Specialty Exam  Presentation General Appearance:  Other (comment) (in scrubs)  Eye Contact: Good  Speech: Clear and Coherent  Speech Volume: Normal  Handedness: Right   Mood and Affect  Mood: Euthymic  Affect: Congruent   Thought Process  Thought Processes: Coherent  Descriptions of Associations:Intact  Orientation:Full (Time, Place and Person)  Thought Content:WDL    Hallucinations:Hallucinations: None  Ideas of Reference:None  Suicidal Thoughts:Suicidal Thoughts: No  Homicidal Thoughts:Homicidal Thoughts: No  Sensorium  Memory: Immediate Good  Judgment: Intact  Insight: Present   Executive Functions  Concentration: Good  Attention Span: Good  Recall: Good  Fund of Knowledge: Good  Language: Good   Psychomotor Activity  Psychomotor  Activity: Psychomotor Activity: Normal   Assets  Assets: Communication Skills; Desire for Improvement   Sleep  Sleep: Sleep: Good   Nutritional Assessment (For OBS and FBC admissions only) Has the patient had a weight loss or gain of 10 pounds or more in the last 3 months?: No Has the patient had a decrease in food intake/or appetite?: No Does the patient have dental problems?: No Does the patient have eating habits or behaviors that may be indicators of an eating disorder including binging or inducing vomiting?: No Has the patient recently lost weight without trying?: 0 Has the patient been eating poorly because of a decreased appetite?: 0 Malnutrition Screening Tool Score: 0    Physical Exam Constitutional:      General: She is not in acute distress.    Appearance: She is not diaphoretic.  HENT:     Right Ear: External ear normal.     Left Ear: External ear normal.     Nose: No congestion.  Pulmonary:     Effort: No respiratory distress.  Chest:     Chest wall: No tenderness.  Neurological:     Mental Status: She is alert and oriented to person, place, and time.  Psychiatric:        Attention and Perception: Attention and perception normal.        Mood and Affect: Mood and affect normal.        Speech: Speech normal.        Thought Content: Thought content normal.        Cognition and Memory: Cognition and memory normal.    Review of Systems  Constitutional:  Negative for chills, diaphoresis and fever.  HENT:  Negative for congestion.   Eyes:  Negative for discharge.  Respiratory:  Negative for cough, shortness of breath and wheezing.   Cardiovascular:  Negative for chest pain and palpitations.  Gastrointestinal:  Negative for diarrhea, nausea and vomiting.  Neurological:  Negative for dizziness and headaches.  Psychiatric/Behavioral:  Positive for substance abuse.     Blood pressure 128/87, pulse 80, temperature 98.7 F (37.1 C), temperature source Oral,  resp. rate 20, SpO2 98%. There is no height or weight on file to calculate BMI.  Past Psychiatric History: See H & P   Is the patient at risk to self? No  Has the patient been a risk to self in the past 6 months? Yes .    Has the patient been a risk to self within the distant past? Yes   Is the patient a risk to others? No   Has the patient been a risk to others in the past 6 months? Yes   Has the patient been a risk to others within the distant past? Yes   Past Medical History: See Chart  Family History: N/A  Social History: N/A  Last Labs:  Admission on 01/05/2024, Discharged on 01/06/2024  Component Date Value Ref Range Status   Sodium 01/06/2024 137  135 - 145 mmol/L Final   Potassium 01/06/2024 4.0  3.5 - 5.1 mmol/L Final   Chloride 01/06/2024 107  98 - 111 mmol/L Final   CO2 01/06/2024 20 (L)  22 - 32 mmol/L Final   Glucose, Bld 01/06/2024 116 (H)  70 - 99  mg/dL Final   Glucose reference range applies only to samples taken after fasting for at least 8 hours.   BUN 01/06/2024 <5 (L)  6 - 20 mg/dL Final   Creatinine, Ser 01/06/2024 0.46  0.44 - 1.00 mg/dL Final   Calcium 96/29/5284 9.6  8.9 - 10.3 mg/dL Final   Total Protein 13/24/4010 7.6  6.5 - 8.1 g/dL Final   Albumin 27/25/3664 4.2  3.5 - 5.0 g/dL Final   AST 40/34/7425 33  15 - 41 U/L Final   ALT 01/06/2024 48 (H)  0 - 44 U/L Final   Alkaline Phosphatase 01/06/2024 59  38 - 126 U/L Final   Total Bilirubin 01/06/2024 0.9  0.0 - 1.2 mg/dL Final   GFR, Estimated 01/06/2024 >60  >60 mL/min Final   Comment: (NOTE) Calculated using the CKD-EPI Creatinine Equation (2021)    Anion gap 01/06/2024 10  5 - 15 Final   Performed at [facility], [address] [place], [place] [address]   Alcohol, Ethyl (B) 01/06/2024 297 (H)  <15 mg/dL Final   Comment: Please note change in reference range. (NOTE) For medical purposes only. Performed at [facility], [address] [place],  [place] [address]    Opiates 01/06/2024 NONE DETECTED  NONE DETECTED Final   Cocaine 01/06/2024 NONE DETECTED  NONE DETECTED Final   Benzodiazepines 01/06/2024 NONE DETECTED  NONE DETECTED Final   Amphetamines 01/06/2024 NONE DETECTED  NONE DETECTED Final   Tetrahydrocannabinol 01/06/2024 POSITIVE (A)  NONE DETECTED Final   Barbiturates 01/06/2024 NONE DETECTED  NONE DETECTED Final   Comment: (NOTE) DRUG SCREEN FOR MEDICAL PURPOSES ONLY.  IF CONFIRMATION IS NEEDED FOR ANY PURPOSE, NOTIFY LAB WITHIN 5 DAYS.  LOWEST DETECTABLE LIMITS FOR URINE DRUG SCREEN Drug Class                     Cutoff (ng/mL) Amphetamine and metabolites    1000 Barbiturate and metabolites    200 Benzodiazepine                 200 Opiates and metabolites        300 Cocaine and metabolites        300 THC                            50 Performed at [facility], [address], [place], [place] [address]    WBC 01/06/2024 7.3  4.0 - 10.5 K/uL Final   RBC 01/06/2024 4.56  3.87 - 5.11 MIL/uL Final   Hemoglobin 01/06/2024 15.0  12.0 - 15.0 g/dL Final   HCT 88/41/6606 43.3  36.0 - 46.0 % Final   MCV 01/06/2024 95.0  80.0 - 100.0 fL Final   MCH 01/06/2024 32.9  26.0 - 34.0 pg Final   MCHC 01/06/2024 34.6  30.0 - 36.0 g/dL Final   RDW 30/16/0109 11.5  11.5 - 15.5 % Final   Platelets 01/06/2024 362  150 - 400 K/uL Final   nRBC 01/06/2024 0.0  0.0 - 0.2 % Final   Neutrophils Relative % 01/06/2024 61  % Final   Neutro Abs 01/06/2024 4.5  1.7 - 7.7 K/uL Final   Lymphocytes Relative 01/06/2024 28  % Final   Lymphs Abs 01/06/2024 2.0  0.7 - 4.0 K/uL Final   Monocytes Relative 01/06/2024 9  % Final   Monocytes Absolute 01/06/2024 0.6  0.1 - 1.0 K/uL Final  Eosinophils Relative 01/06/2024 1  % Final   Eosinophils Absolute 01/06/2024 0.1  0.0 - 0.5 K/uL Final   Basophils Relative 01/06/2024 0  % Final   Basophils Absolute 01/06/2024 0.0  0.0 - 0.1 K/uL Final   Immature Granulocytes 01/06/2024 1  % Final    Abs Immature Granulocytes 01/06/2024 0.04  0.00 - 0.07 K/uL Final   Performed at [facility], [address] [place], [place] [address]   Preg, Serum 01/06/2024 NEGATIVE  NEGATIVE Final   Comment:        THE SENSITIVITY OF THIS METHODOLOGY IS >10 mIU/mL. Performed at [facility], [address] [place], [place] [address]     Allergies: Dilantin [phenytoin]  Medications:  Facility Ordered Medications  Medication   [COMPLETED] ziprasidone (GEODON) injection 20 mg   [COMPLETED] sterile water (preservative free) injection   acetaminophen  (TYLENOL ) tablet 650 mg   alum & mag hydroxide-simeth (MAALOX/MYLANTA) 200-200-20 MG/5ML suspension 30 mL   magnesium hydroxide (MILK OF MAGNESIA) suspension 30 mL   [START ON 01/07/2024] multivitamin with minerals tablet 1 tablet   chlordiazePOXIDE (LIBRIUM) capsule 25 mg   hydrOXYzine (ATARAX) tablet 25 mg   loperamide (IMODIUM) capsule 2-4 mg   ondansetron  (ZOFRAN -ODT) disintegrating tablet 4 mg   haloperidol (HALDOL) tablet 5 mg   And   diphenhydrAMINE  (BENADRYL ) capsule 50 mg   haloperidol lactate (HALDOL) injection 5 mg   And   diphenhydrAMINE  (BENADRYL ) injection 50 mg   And   LORazepam (ATIVAN) injection 2 mg   haloperidol lactate (HALDOL) injection 10 mg   And   diphenhydrAMINE  (BENADRYL ) injection 50 mg   And   LORazepam (ATIVAN) injection 2 mg   traZODone (DESYREL) tablet 50 mg   [START ON 01/07/2024] FLUoxetine  (PROZAC ) capsule 10 mg   nicotine (NICODERM CQ - dosed in mg/24 hours) patch 21 mg   cloNIDine (CATAPRES) tablet 0.1 mg   PTA Medications  Medication Sig   FLUoxetine  (PROZAC ) 10 MG capsule TAKE 1 CAPSULE BY MOUTH EVERY DAY   olmesartan  (BENICAR ) 20 MG tablet Take 1 tablet (20 mg total) by mouth daily. (Patient not taking: Reported on 01/06/2024)   propranolol  (INDERAL ) 10 MG tablet Take 1 tablet (10 mg total) by mouth 3 (three) times daily as needed (For anxiety.).   mirtazapine  (REMERON) 15 MG tablet Take 15 mg by mouth at bedtime.   Nicotine (NICODERM CQ TD) Place 1 patch onto the skin daily as needed (for vaping cessation).   nicotine polacrilex (NICORETTE) 4 MG gum Take 4 mg by mouth as needed (for vaping/smoking cessation).   Aspirin-Salicylamide-Caffeine (BC HEADACHE POWDER PO) Take 1 packet by mouth 2 (two) times daily as needed (for headaches).      Medical Decision Making  Recommend admission to the continuous observation unit for safety monitoring and re-eval in am. Patient reports she is ready to be discharged tomorrow back to home. Seeks to move out of her parents house. Denies alcohol dependence. She remains under IVC.   I have reviewed the labs and medications adminitsered at [facility].   Recommend CIWA protocol-BAL 297  Home medications continued -Prozac  10 mg PO daily at bedtime for depression symptoms  Other Prns -Clonidine 0.1 mg PO once for HTN -Tylenol , Maalox, MOM, Trazodone -Nicoderm CQ 21 mg/24 hr transdermal patch for nicotine dependence  -Prn agitation protocol medications   Recommendations  Based on my evaluation the patient does not appear to have an emergency medical condition.  Recommend admission to the  continuous observation unit for safety monitoring and re-eval in am.   Sandralee Crow, NP 01/06/24  10:13 PM

## 2024-01-06 NOTE — ED Notes (Signed)
Pt refused medications at this time

## 2024-01-06 NOTE — Progress Notes (Signed)
 [facility] LCSW Progress Note   01/06/2024    6:26 PM  Kathy Guerrero   [id number]   Type of Contact and Topic:  Psychiatric Bed Placement   Pt accepted to [facility]     Patient meets inpatient criteria per Chandra Come, PMHNP   The attending provider will be Dr. Kathlen Para  Call report to [phone]  Alvenia Aus, RN @ [facility] notified.     Pt scheduled  to arrive at [facility] for TONIGHT.    Phares Brasher, MSW, LCSW-A  6:27 PM 01/06/2024

## 2024-01-06 NOTE — ED Notes (Signed)
 Pt has one personal belonging bag with a shirt, undergarments, pants, socks, earrings, ring, and bracelets located in the cabinet in the nursing station from 9-12.

## 2024-01-06 NOTE — ED Notes (Signed)
 Pt became visibly agitated while speaking with mom.

## 2024-01-06 NOTE — ED Notes (Signed)
 Pt refusing all medical treatment, including vitals.

## 2024-01-06 NOTE — BH Assessment (Addendum)
 Patient received 20mg  Geodon IM at 00:48.  At 06:12 this clinician checked with RN [facility] and she confirmed that patient is sleeping soundly from sedation and an elevated ETOH level.  Pt will be seen by provider during the day.

## 2024-01-06 NOTE — ED Notes (Signed)
Security wanded pt ?

## 2024-01-06 NOTE — Consult Note (Signed)
 [facility] Psychiatric Consult Initial  Patient Name: .Kathy Guerrero  MRN: [id number]  DOB: 01-12-1991  Consult Order details:  Orders (From admission, onward)     Start     Ordered   01/06/24 0240  CONSULT TO CALL ACT TEAM       Ordering Provider: Lindle Rhea, MD  Provider:  (Not yet assigned)  Question:  Reason for Consult?  Answer:  Psych consult   01/06/24 0240             Mode of Visit: In person    Psychiatry Consult Evaluation  Service Date: Jan 06, 2024 LOS:  LOS: 0 days  Chief Complaint MDD  Primary Psychiatric Diagnoses  Major depressive disorder 2.   Generalized anxiety disorder 3.   Alcohol abuse  Assessment  Kathy Guerrero is a 33 y.o. female admitted: Presented to the [facility] 01/05/2024 11:42 PM d/t having auditory hallucinations. Pt broke the glass on front door of home. Pt running through the neighborhood threatening to kill neighbors and family members. Alcohol abuse She carries the psychiatric diagnoses of MDD, anxiety, and alcohol abuse and has a past medical history of none.   Her current presentation of alcohol abuse, worthlessness, labile mood, anhedonia is most consistent with major depressive disorder, treated. She meets criteria for inpatient detox/psychiatric admission based on current symptoms.  Current outpatient psychotropic medications include Remeron, propranolol , and Prozac  and historically she has had a beneficial response to these medications. She was none compliant with medications prior to admission as evidenced by patient's mother. On initial examination, patient is labile, cooperative, and calm. Please see plan below for detailed recommendations.   Diagnoses:  Active Hospital problems: Principal Problem:   MDD (major depressive disorder) Active Problems:   Generalized anxiety disorder    Plan   ## Psychiatric Medication Recommendations:  Continue patient's home medications Start patient on CIWA protocol with  Ativan detox PRN  ## Medical Decision Making Capacity: Not specifically addressed in this encounter  ## Further Work-up:  -- No further workup needed at this time EKG, U/A, or UDS -- most recent EKG on 01/06/24 had QtC of 454 -- Pertinent labwork reviewed earlier this admission includes: CBC, CMP, EKG, UDS   ## Disposition:--We recommend the patient be admitted to facility based crisis Center at behavioral health urgent care for detox.  Patient is involuntarily committed, placed on 01/05/24.  ## Behavioral / Environmental: -Delirium Precautions: Delirium Interventions for Nursing and Staff: - RN to open blinds every AM. - To Bedside: Glasses, hearing aide, and pt's own shoes. Make available to patients. when possible and encourage use. - Encourage po fluids when appropriate, keep fluids within reach. - OOB to chair with meals. - Passive ROM exercises to all extremities with AM & PM care. - RN to assess orientation to person, time and place QAM and PRN. - Recommend extended visitation hours with familiar family/friends as feasible. - Staff to minimize disturbances at night. Turn off television when pt asleep or when not in use., To minimize splitting of staff, assign one staff person to communicate all information from the team when feasible., or Utilize compassion and acknowledge the patient's experiences while setting clear and realistic expectations for care.    ## Safety and Observation Level:  - Based on my clinical evaluation, I estimate the patient to be at low risk of self harm in the current setting. - At this time, we recommend  routine. This decision is based on my review of the  chart including patient's history and current presentation, interview of the patient, mental status examination, and consideration of suicide risk including evaluating suicidal ideation, plan, intent, suicidal or self-harm behaviors, risk factors, and protective factors. This judgment is based on our ability to  directly address suicide risk, implement suicide prevention strategies, and develop a safety plan while the patient is in the clinical setting. Please contact our team if there is a concern that risk level has changed.  CSSR Risk Category:C-SSRS RISK CATEGORY: No Risk  Suicide Risk Assessment: Patient has following modifiable risk factors for suicide: under treated depression , recklessness, and medication noncompliance, which we are addressing by recommending that patient be admitted to [facility] for alcohol detox. Patient has following non-modifiable or demographic risk factors for suicide: separation or divorce, history of self harm behavior, and psychiatric hospitalization Patient has the following protective factors against suicide: Supportive family, Pets in the home, and Minor children in the home  Thank you for this consult request. Recommendations have been communicated to the primary team.  We will recommend that patient be admitted to [facility] for alcohol detox at this time.   Kathy Guerrero, PMHNP       History of Present Illness  Relevant Aspects of Hospital ED Course:  Admitted on 01/05/2024 d/t having auditory hallucinations. Pt broke the glass on front door of home. Pt running through the neighborhood threatening to kill neighbors and family members and Alcohol abuse.   Patient Report:  Kathy Guerrero, 33 y.o., female patient seen face to face by this provider, consulted with Dr. Deborah Falling; and chart reviewed on 01/06/24.  On evaluation Kathy Guerrero reports that she moved in with her parents about 4 weeks ago, due to ending an 8-year relationship with her ex-boyfriend who is also the father of her 64-year-old daughter.  She states since living with her parents it has been very hard, stating that in the home it is herself, mother and father, stated they all have OCD personalities, and also they all tend to have controlling personalities.  She states that last night they locked  her out of the home, due to a disagreement that they had, because she does not have a job at the present moment, but states she is looking.  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  She states she and her parents do not agree on a lot of things.  She states because he locked her out of the home she attempted to get her close and some belongings, so they would not let her back in she broke the glass on the front door.  She currently did not want to harm herself or any family members, currently denies SI/HI/AVH.  Denies using any illicit substances, except for marijuana vape occasionally, endorses drinking a bottle of wine daily for about a 4 days, but states prior she had not drank in 4 weeks, states she started drinking again to celebrate her 4-week sobriety.  She feels that she does not need any help with alcohol or mentally, states she was just discharged from [facility] for inpatient stay about 2 weeks ago.  States she is diagnosed with anxiety and depression, states that she is currently only taking her propranolol  and melatonin which she states helped with anxiety and sleep.  She states now that she knows she cannot go back to  her parent's house because of the incident last night, states that she will move in with a friend, when she is discharged. Patient states that her sleep is good and appetite is fair, especially when she is drinking alcohol.  During evaluation Kathy Guerrero is sitting up on the side of the bed, and appears to be in moderate distress, as she feels she does not need to be here and is ready to go home. She is alert, oriented x 4, anxious, but cooperative and attentive.  Her mood is labile with congruent affect. She has normal speech, and behavior.  Objectively there is no evidence of psychosis/mania or delusional thinking.  Patient is able to converse coherently,  goal directed thoughts, no distractibility, or pre-occupation.  She currently denies suicidal/self-harm/homicidal ideation, psychosis, and paranoia.     Psych ROS:  Depression: Endorses sadness Anxiety: Endorses Mania (lifetime and current): Denies Psychosis: (lifetime and current): Denies  Collateral information:  Contacted patient's mother Ayano Philemon today at 01/06/24.  She states that patient has a history of alcohol abuse, confirmed that patient did just separate from her ex-boyfriend around the end of March, states she told her parents that she did have a drinking problem, and she wanted to try and quit herself, so they allowed it.  She states that patient began drinking heavily in April, states that they talked to her about getting a job while she was living with them, and informed her that she had to quit drinking and get into a detox program, and if she was able to do so they would help her get a house.  She states that patient felt so much pressure that she lashed out and began arguing and screaming at them, states she was not able to get a job, so they did not close on the house, and patient became resentful towards them, and called the cops on her parents, the cops came to the house.  She states that the cops told her and patient's father that patient was mentally unstable, patient was drunk, and father went to try and get an IVC taking out on patient and was denied.  She states about a week later she heard patient vomiting in the bathroom, states she has not checked on her, said that patient began screaming and yelling at her and telling her to get out and cursing at her and states the father called the cops on patient due to her belligerent behavior.  She states that patient would not allow the cops coming to her room because she felt the cops were casting spells on her, states they had to end up handcuffing her and putting her in her car.  States patient was admitted to an inpatient  facility and weight force for 2 weeks, says she recently came back about a week ago, since this week she has been drinking again.  She states that patient is usually good  parent towards her daughter, but lately the alcohol has been getting in the way.  She states the incident last night, said that patient was sitting on the front porch and looked sad, so she and patient's father wants to confront patient, stated patient began yelling and screaming telling her father not to touch her and calling him a rapist, said that she was having spells casted on her.  Mother states she does not understand why patient will call her father names, states they are usually best friends, but feels that patient's father has been enforcing more rules  at home.  She states that patient continued to yell until the cops were called by the neighbors, then patient's father went to get an IVC, states she would not allow patient to Guerrero into the home while the father was not there, the cops waited with her outside, when she found out she was IVC she then broke the glass on the front door to try and get her belongings.  She states that patient presents as if everything is okay with her, but it is not, she tells people that she and her ex boyfriend are great coparents, which is not the truth, states that there was an awful break-up and ex-boyfriend is trying to get full custody of their daughter.  States that in order to see the daughter on weekends, the ex-boyfriend enforced rules that patient cannot drink and it has to be while her parents are home in order to see her daughter. Mother also states that patient has a Soil scientist, that appears to be "dark in nature "she states and talks about a lot of killing and satanic things.  She states once that patient is discharge, facility, presently she is not welcome back into their home.  Review of Systems  Psychiatric/Behavioral:  Positive for depression and substance abuse.      Psychiatric  and Social History  Psychiatric History:  Information collected from patient and mother   Prev Dx/Sx: Anxiety and depression Current Psych Provider: None Home Meds (current): See above Previous Med Trials: Yes Therapy: Denies  Prior Psych Hospitalization: Yes Prior Self Harm: Yes Prior Violence: Yes  Family Psych History: Denies Family Hx suicide: Denies  Social History:  Developmental Hx: Deferred Educational Hx: Patient graduated high school Occupational Hx: Unemployed Legal Hx: Denies Living Situation: Lives at home with parents Spiritual Hx: None Access to weapons/lethal means: Denies  Substance History Alcohol: Yes Type of alcohol wine Last Drink yesterday Number of drinks per day bottle of wine daily History of alcohol withdrawal seizures denies History of DT's yes Tobacco: Denies Illicit drugs: Marijuana Prescription drug abuse: Denies Rehab hx: Denies  Exam Findings  Physical Exam:  Vital Signs:  Temp:  [97.6 F (36.4 C)-98 F (36.7 C)] 97.6 F (36.4 C) (05/15 1146) Pulse Rate:  [89-98] 98 (05/15 1425) Resp:  [12-20] 20 (05/15 1146) BP: (108-142)/(74-97) 122/82 (05/15 1425) SpO2:  [92 %-100 %] 99 % (05/15 1146) Weight:  [65.8 kg] 65.8 kg (05/15 0737) Blood pressure 122/82, pulse 98, temperature 97.6 F (36.4 C), temperature source Oral, resp. rate 20, height 5\' 4"  (1.626 m), weight 65.8 kg, SpO2 99%. Body mass index is 24.89 kg/m.  Physical Exam Vitals and nursing note reviewed. Exam conducted with a chaperone present.  Neurological:     Mental Status: She is alert.  Psychiatric:        Attention and Perception: Attention normal.        Mood and Affect: Mood is anxious. Affect is labile.        Speech: Speech normal.        Behavior: Behavior is cooperative.        Cognition and Memory: Cognition is impaired.        Judgment: Judgment is inappropriate.     Mental Status Exam: General Appearance: Casual  Orientation:  Full (Time, Place, and  Person)  Memory:  Immediate;   Fair Remote;   Fair  Concentration:  Concentration: Fair and Attention Span: Fair  Recall:  Fair  Attention  Fair  Eye Contact:  Minimal  Speech:  Clear and Coherent  Language:  Fair  Volume:  Normal  Mood: Labile  Affect:  Flat and Labile  Thought Process:  Coherent  Thought Content:  WDL  Suicidal Thoughts:  No  Homicidal Thoughts:  No  Judgement:  Impaired  Insight:  Lacking  Psychomotor Activity:  Normal  Akathisia:  NA  Fund of Knowledge:  Fair      Assets:  Engineer, maintenance Social Support  Cognition:  Impaired,  Mild  ADL's:  Intact  AIMS (if indicated):        Other History   These have been pulled in through the EMR, reviewed, and updated if appropriate.  Family History:  The patient's family history includes Alcohol abuse in her paternal grandfather and paternal grandmother; Arthritis in her maternal grandmother; Asthma in her mother; Cancer in her maternal grandmother and paternal grandmother; Depression in her maternal grandfather and paternal grandmother; Diabetes in her maternal grandfather and paternal grandfather; Heart disease in her maternal grandfather and paternal grandfather; Hyperlipidemia in her father, maternal grandfather, maternal grandmother, mother, and paternal grandfather; Hypertension in her father, maternal grandfather, and paternal grandmother; Kidney disease in her brother, maternal grandfather, and paternal grandmother.  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   Seizures (HCC)    as a child, none since then   UTI (urinary tract infection)     Surgical History: Past Surgical History:  Procedure Laterality Date   NO PAST SURGERIES     WISDOM TOOTH EXTRACTION       Medications:   Current Facility-Administered Medications:    haloperidol (HALDOL) tablet 5 mg, 5 mg, Oral, Q6H PRN **AND** benztropine  (COGENTIN) tablet 1 mg, 1 mg, Oral, Q6H PRN, Cardama, Camila Cecil, MD   ibuprofen  (ADVIL ) tablet 600 mg, 600 mg, Oral, Q8H PRN, Cardama, Camila Cecil, MD   LORazepam (ATIVAN) injection 0-4 mg, 0-4 mg, Intravenous, Q6H **OR** LORazepam (ATIVAN) tablet 0-4 mg, 0-4 mg, Oral, Q6H, Cardama, Camila Cecil, MD   [START ON 01/08/2024] LORazepam (ATIVAN) tablet 1 mg, 1 mg, Oral, Q12H **OR** [START ON 01/08/2024] LORazepam (ATIVAN) tablet 0-4 mg, 0-4 mg, Oral, Q12H, Cardama, Camila Cecil, MD   ondansetron  (ZOFRAN ) tablet 4 mg, 4 mg, Oral, Q8H PRN, Cardama, Camila Cecil, MD   thiamine (VITAMIN B1) tablet 100 mg, 100 mg, Oral, Daily, 100 mg at 01/06/24 1010 **OR** thiamine (VITAMIN B1) injection 100 mg, 100 mg, Intravenous, Daily, Cardama, Camila Cecil, MD  Current Outpatient Medications:    Aspirin-Salicylamide-Caffeine (BC HEADACHE POWDER PO), Take 1 packet by mouth 2 (two) times daily as needed (for headaches)., Disp: , Rfl:    mirtazapine (REMERON) 15 MG tablet, Take 15 mg by mouth at bedtime., Disp: , Rfl:    Nicotine (NICODERM CQ TD), Place 1 patch onto the skin daily as needed (for vaping cessation)., Disp: , Rfl:    nicotine polacrilex (NICORETTE) 4 MG gum, Take 4 mg by mouth as needed (for vaping/smoking cessation)., Disp: , Rfl:    propranolol  (INDERAL ) 10 MG tablet, Take 1 tablet (10 mg total) by mouth 3 (three) times daily as needed (For anxiety.)., Disp: 30 tablet, Rfl: 0   FLUoxetine  (PROZAC ) 10 MG capsule, TAKE 1 CAPSULE BY MOUTH EVERY DAY (Patient not taking: Reported on 01/06/2024), Disp: 30 capsule, Rfl: 1   olmesartan  (BENICAR ) 20 MG tablet, Take 1 tablet (20 mg total) by mouth daily. (Patient not taking: Reported on 01/06/2024), Disp:  30 tablet, Rfl: 5  Allergies: Allergies  Allergen Reactions   Dilantin [Phenytoin] Rash    Carleta Woodrow MOTLEY-MANGRUM, PMHNP

## 2024-01-06 NOTE — ED Provider Notes (Signed)
 [place] EMERGENCY DEPARTMENT AT [facility] Provider Note  CSN: [id number] Arrival date & time: 01/05/24 2340  Chief Complaint(s) IVC  HPI Kathy Guerrero is a 33 y.o. female with a past medical history listed below brought in by PD after being IVC by family member.    Per the IVC paperwork: " Respondent has been diagnosed with chemical imbalance, depression, anxiety, alcoholism.  Respondent has been prescribed medications.  Respondent was admitted a few weeks ago to [facility].  Respondent drinks alcohol daily.  Respondent appears to be hallucinating and hearing voices.  Respondent broke the front door glass this evening.  Respondent is running through the neighborhood threatening to kill the neighbors and her family.  Respondent is a danger to self.  Respondent is a change her to the others."  She appears to be intoxicated and currently uncooperative at this time.  Yelling throughout the encounter.  HPI  Past Medical History Past Medical History:  Diagnosis Date   History of anxiety 03/15/2019   Postpartum hypertension 07/04/2019   Seizures (HCC)    as a child, none since then   UTI (urinary tract infection)    Patient Active Problem List   Diagnosis Date Noted   Primary hypertension 11/09/2022   Generalized anxiety disorder 11/09/2022   Vitamin D  deficiency 02/20/2019   Home Medication(s) Prior to Admission medications   Medication Sig Start Date End Date Taking? Authorizing Provider  FLUoxetine  (PROZAC ) 10 MG capsule TAKE 1 CAPSULE BY MOUTH EVERY DAY 01/06/23   Versa Gore, NP  mirtazapine (REMERON) 15 MG tablet Take 15 mg by mouth at bedtime.    [provider]  nicotine (NICODERM CQ - DOSED IN MG/24 HR) 7 mg/24hr patch Place 7 mg onto the skin daily.    [provider]  olmesartan  (BENICAR ) 20 MG tablet Take 1 tablet (20 mg total) by mouth daily. 11/09/22   Versa Gore, NP  propranolol  (INDERAL ) 10 MG tablet Take  1 tablet (10 mg total) by mouth 3 (three) times daily as needed (For anxiety.). 11/09/22   Versa Gore, NP                                                                                                                                    Allergies Dilantin [phenytoin]  Review of Systems Review of Systems As noted in HPI  Physical Exam Vital Signs  I have reviewed the triage vital signs BP (!) 142/97   Pulse 89   Temp 97.9 F (36.6 C) (Axillary)   Resp 12   SpO2 92%   Physical Exam Vitals reviewed.  Constitutional:      General: She is not in acute distress.    Appearance: She is well-developed. She is not diaphoretic.  HENT:     Head: Normocephalic and atraumatic.     Right Ear: External ear normal.     Left Ear: External ear normal.  Nose: Nose normal.  Eyes:     General: No scleral icterus.    Conjunctiva/sclera: Conjunctivae normal.  Neck:     Trachea: Phonation normal.  Cardiovascular:     Rate and Rhythm: Normal rate and regular rhythm.  Pulmonary:     Effort: Pulmonary effort is normal. No respiratory distress.     Breath sounds: No stridor.  Abdominal:     General: There is no distension.  Musculoskeletal:        General: Normal range of motion.     Cervical back: Normal range of motion.  Neurological:     Mental Status: She is alert and oriented to person, place, and time.  Psychiatric:        Attention and Perception: Attention normal. She does not perceive auditory or visual hallucinations.        Mood and Affect: Affect is angry.        Behavior: Behavior is agitated.     ED Results and Treatments Labs (all labs ordered are listed, but only abnormal results are displayed) Labs Reviewed  COMPREHENSIVE METABOLIC PANEL WITH GFR - Abnormal; Notable for the following components:      Result Value   CO2 20 (*)    Glucose, Bld 116 (*)    BUN <5 (*)    ALT 48 (*)    All other components within normal limits  ETHANOL - Abnormal; Notable for  the following components:   Alcohol, Ethyl (B) 297 (*)    All other components within normal limits  RAPID URINE DRUG SCREEN, HOSP PERFORMED - Abnormal; Notable for the following components:   Tetrahydrocannabinol POSITIVE (*)    All other components within normal limits  CBC WITH DIFFERENTIAL/PLATELET  HCG, SERUM, QUALITATIVE                                                                                                                         EKG  EKG Interpretation Date/Time:  Thursday Jan 06 2024 01:51:25 EDT Ventricular Rate:  92 PR Interval:  179 QRS Duration:  86 QT Interval:  367 QTC Calculation: 454 R Axis:   60  Text Interpretation: Sinus rhythm Anteroseptal infarct, old Nonspecific T abnormalities, lateral leads No old tracing to compare Confirmed by Townsend Freud [phone]) on 01/06/2024 2:36:10 AM       Radiology No results found.  Medications Ordered in ED Medications  LORazepam (ATIVAN) injection 0-4 mg ( Intravenous Not Given 01/06/24 0300)    Or  LORazepam (ATIVAN) tablet 0-4 mg ( Oral See Alternative 01/06/24 0300)  LORazepam (ATIVAN) tablet 1 mg (has no administration in time range)    Or  LORazepam (ATIVAN) tablet 0-4 mg (has no administration in time range)  thiamine (VITAMIN B1) tablet 100 mg (has no administration in time range)    Or  thiamine (VITAMIN B1) injection 100 mg (has no administration in time range)  haloperidol (HALDOL) tablet 5 mg (has no administration in time range)  And  benztropine (COGENTIN) tablet 1 mg (has no administration in time range)  ibuprofen  (ADVIL ) tablet 600 mg (has no administration in time range)  ondansetron  (ZOFRAN ) tablet 4 mg (has no administration in time range)  ziprasidone (GEODON) injection 20 mg (20 mg Intramuscular Given 01/06/24 0048)  sterile water (preservative free) injection (1.2 mLs Intramuscular Given 01/06/24 0048)   Procedures .Critical Care  Performed by: Lindle Rhea, MD Authorized by:  Lindle Rhea, MD   Critical care provider statement:    Critical care time (minutes):  30   Critical care was necessary to treat or prevent imminent or life-threatening deterioration of the following conditions: aggression requiring chemical restraints.   Critical care was time spent personally by me on the following activities:  Development of treatment plan with patient or surrogate, discussions with consultants, evaluation of patient's response to treatment, examination of patient, ordering and review of laboratory studies, ordering and review of radiographic studies, ordering and performing treatments and interventions, pulse oximetry, re-evaluation of patient's condition and review of old charts   (including critical care time) Medical Decision Making / ED Course   Medical Decision Making Amount and/or Complexity of Data Reviewed Labs: ordered. Decision-making details documented in ED Course.  Risk OTC drugs. Prescription drug management.    IVC'd by family for hallucinations, aggressive behavior, suicidal threats. She is uncooperative. Appears to be intoxicated but in no acute distress.  Clinical Course as of 01/06/24 0711  Thu Jan 06, 2024  0045 Patient was agitated and aggressive requiring chemical restraints. Prior EKG from OSH with normal QTc. Geodon ordered.  [PC]  0234 Labs notable for alcohol intoxication with alcohol level of 297.  UDS positive for THC.  No significant electrolyte derangements or metabolic derangements.  Patient is medically cleared for behavioral health evaluation and management.  [PC]    Clinical Course User Index [PC] Annel Zunker, Camila Cecil, MD    Final Clinical Impression(s) / ED Diagnoses Final diagnoses:  Alcohol intoxication with delirium [facility])  Hallucinations  Homicidal ideations    This chart was dictated using voice recognition software.  Despite best efforts to proofread,  errors can occur which can change the  documentation meaning.    Lindle Rhea, MD 01/06/24 [phone]

## 2024-01-06 NOTE — ED Notes (Signed)
 Report given to Angie, Charity fundraiser.

## 2024-01-06 NOTE — ED Notes (Signed)
 Patient admission status needs to be changed from [facility] to obs.

## 2024-01-06 NOTE — ED Triage Notes (Signed)
 Pt BIB [facility] d/t having auditory hallucinations. Pt broke the glass on front door of home. Pt running through the neighborhood threatening to kill neighbors and family members. Pt stated she had drank a glass of wine today. Pt drinks ETOH daily.

## 2024-01-06 NOTE — ED Notes (Signed)
 Pt accepted to [facility]. FACILITY BASED CRISIS . Attending MD Kathlen Para . RN TO RN report to [phone]. Pt IVC paperwork has been reviewed. Address is [address]

## 2024-01-06 NOTE — ED Provider Notes (Signed)
 NP attempted to assess face to face- patient is difficult to arouse, RN reported patient had Geodon IM last night- will follow-up when patient is able to participate in assessment. - Recruitment consultant at bedside

## 2024-01-06 NOTE — ED Notes (Addendum)
 Pt made 2 of 3 calls.

## 2024-01-06 NOTE — ED Notes (Signed)
 Pt made 1 of 3 calls

## 2024-01-06 NOTE — ED Notes (Signed)
 Pt mom/emergency contact called, this RN updated mom on pt status.  Mom wants to be notified when pt is moved to new facility.  Updated contact information in the [facility] for pt's dad.

## 2024-01-06 NOTE — ED Notes (Signed)
 Pt's belonging bag (1) placed in [place] [address]

## 2024-01-06 NOTE — ED Notes (Signed)
 Patient resting in bed breathing eyes closed

## 2024-01-06 NOTE — ED Notes (Signed)
 Patient transported to [facility] at this time. VS obtained and personal belongings given to [facility] at this time from locker [address]. IVC paperwork, med necces form, and face sheet also provided.

## 2024-01-07 ENCOUNTER — Other Ambulatory Visit (HOSPITAL_COMMUNITY)
Admission: EM | Admit: 2024-01-07 | Discharge: 2024-01-11 | Disposition: A | Discharge status: Home / Self Care | Attending: Psychiatry | Admitting: Psychiatry

## 2024-01-07 DIAGNOSIS — Z046 Encounter for general psychiatric examination, requested by authority: Secondary | ICD-10-CM

## 2024-01-07 DIAGNOSIS — R443 Hallucinations, unspecified: Secondary | ICD-10-CM | POA: Insufficient documentation

## 2024-01-07 DIAGNOSIS — F1090 Alcohol use, unspecified, uncomplicated: Secondary | ICD-10-CM | POA: Diagnosis present

## 2024-01-07 DIAGNOSIS — Z79899 Other long term (current) drug therapy: Secondary | ICD-10-CM | POA: Diagnosis not present

## 2024-01-07 DIAGNOSIS — F4323 Adjustment disorder with mixed anxiety and depressed mood: Secondary | ICD-10-CM | POA: Insufficient documentation

## 2024-01-07 MED ORDER — ADULT MULTIVITAMIN W/MINERALS CH
1.0000 | ORAL_TABLET | Freq: Every day | ORAL | Status: DC
  Administered 2024-01-07 – 2024-01-11 (×5): 1 via ORAL
  Filled 2024-01-07 (×5): qty 1

## 2024-01-07 MED ORDER — HALOPERIDOL 5 MG PO TABS: 5.0000 mg | ORAL_TABLET | Freq: Three times a day (TID) | ORAL | Status: DC | PRN

## 2024-01-07 MED ORDER — LORAZEPAM 2 MG/ML IJ SOLN: 2.0000 mg | Freq: Three times a day (TID) | INTRAMUSCULAR | Status: DC | PRN

## 2024-01-07 MED ORDER — CHLORDIAZEPOXIDE HCL 25 MG PO CAPS: 25.0000 mg | ORAL_CAPSULE | Freq: Four times a day (QID) | ORAL | Status: DC | PRN

## 2024-01-07 MED ORDER — ALUM & MAG HYDROXIDE-SIMETH 200-200-20 MG/5ML PO SUSP: 30.0000 mL | ORAL | Status: DC | PRN

## 2024-01-07 MED ORDER — HALOPERIDOL LACTATE 5 MG/ML IJ SOLN: 10.0000 mg | Freq: Three times a day (TID) | INTRAMUSCULAR | Status: DC | PRN

## 2024-01-07 MED ORDER — FLUOXETINE HCL 10 MG PO CAPS: 10.0000 mg | ORAL_CAPSULE | Freq: Every day | ORAL | Status: DC

## 2024-01-07 MED ORDER — DIPHENHYDRAMINE HCL 50 MG/ML IJ SOLN: 50.0000 mg | Freq: Three times a day (TID) | INTRAMUSCULAR | Status: DC | PRN

## 2024-01-07 MED ORDER — LOPERAMIDE HCL 2 MG PO CAPS: 2.0000 mg | ORAL_CAPSULE | ORAL | Status: AC | PRN

## 2024-01-07 MED ORDER — NICOTINE 21 MG/24HR TD PT24
21.0000 mg | MEDICATED_PATCH | Freq: Every day | TRANSDERMAL | Status: DC
  Administered 2024-01-08 – 2024-01-11 (×4): 21 mg via TRANSDERMAL
  Filled 2024-01-07 (×5): qty 1

## 2024-01-07 MED ORDER — MIRTAZAPINE 15 MG PO TABS
15.0000 mg | ORAL_TABLET | Freq: Every day | ORAL | Status: DC
  Administered 2024-01-07 – 2024-01-10 (×4): 15 mg via ORAL
  Filled 2024-01-07 (×4): qty 1

## 2024-01-07 MED ORDER — CHLORDIAZEPOXIDE HCL 25 MG PO CAPS: 25.0000 mg | ORAL_CAPSULE | Freq: Four times a day (QID) | ORAL | Status: AC | PRN

## 2024-01-07 MED ORDER — ONDANSETRON 4 MG PO TBDP: 4.0000 mg | ORAL_TABLET | Freq: Four times a day (QID) | ORAL | Status: AC | PRN

## 2024-01-07 MED ORDER — PROPRANOLOL HCL 10 MG PO TABS
10.0000 mg | ORAL_TABLET | Freq: Three times a day (TID) | ORAL | Status: DC | PRN
  Administered 2024-01-09 – 2024-01-10 (×2): 10 mg via ORAL
  Filled 2024-01-07 (×2): qty 1

## 2024-01-07 MED ORDER — ACETAMINOPHEN 325 MG PO TABS: 650.0000 mg | ORAL_TABLET | Freq: Four times a day (QID) | ORAL | Status: DC | PRN

## 2024-01-07 MED ORDER — HALOPERIDOL LACTATE 5 MG/ML IJ SOLN: 5.0000 mg | Freq: Three times a day (TID) | INTRAMUSCULAR | Status: DC | PRN

## 2024-01-07 MED ORDER — MAGNESIUM HYDROXIDE 400 MG/5ML PO SUSP: 30.0000 mL | Freq: Every day | ORAL | Status: DC | PRN

## 2024-01-07 MED ORDER — THIAMINE MONONITRATE 100 MG PO TABS
100.0000 mg | ORAL_TABLET | Freq: Every day | ORAL | Status: DC
  Administered 2024-01-07 – 2024-01-11 (×5): 100 mg via ORAL
  Filled 2024-01-07 (×5): qty 1

## 2024-01-07 MED ORDER — DIPHENHYDRAMINE HCL 50 MG PO CAPS: 50.0000 mg | ORAL_CAPSULE | Freq: Three times a day (TID) | ORAL | Status: DC | PRN

## 2024-01-07 MED ORDER — HYDROXYZINE HCL 25 MG PO TABS
25.0000 mg | ORAL_TABLET | Freq: Four times a day (QID) | ORAL | Status: AC | PRN
  Administered 2024-01-07: 25 mg via ORAL
  Filled 2024-01-07: qty 1

## 2024-01-07 NOTE — ED Notes (Signed)
 PRN atarax given due to patient reports of anxiety rating 7/10. Medication administered alongside scheduled medications with no complications. Environment secured, safety checks in place per facility policy.

## 2024-01-07 NOTE — Discharge Planning (Addendum)
 LCSW met with patient to assess current mood, affect, physical state, and inquire about needs/goals while here in [facility] and after discharge. Patient reports she presented due to an argument with parents after she recently moved back in with them approximately one month ago. Patient reported that she was drinking a bottle of wine to celebrate not having dranken in over a month. She denies any ETOB abuse and states she used to drink occasionally but then started drinking non alcoholic beers.   Patient reports she was previously living with her S/O her childs father though they were living in separate quarters of the home and co-parenting their 40 year old daughter .Patient reports the child is with her father now and they alternate weeks sharing custody. 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 Patient does have transportation and reports good supports from family, though she feels her father argues with her much.   Patient reports her current goal is to seek residential outpatient resources for mental health psychiatry for medication management and counseling. Patient denies any prior history of outpatient or inpatient substance abuse treatment. Patient was taking medications prescribed by her PCP but has since moved much further from this office while staying with her parents in [place].  SW will arrange for new PCP referral closer to her location. Patient stated she would like to follow up with us  for out patient services on a walk in basis. Information will be provided in AVS and discharge instructions when she is stable for discharge to home with parents. SW will confirm patient is able to return home with family upon discharge and was given permission to contact them. Patient currently denies any SI/HI/AVH and reports mood is calm. Patient noted hoping to discharge by Sunday so she can get her child. Since Sunday is the day her week starts  with 50/50 custody arragement. Patient expressed understanding and appreciation of LCSW assistance. No other needs were reported at this time by patient.

## 2024-01-07 NOTE — ED Notes (Signed)
 Pt observed lying in bed. NAD.  Calm upon approach.   Pt transferring to [facility]  report given to [place].

## 2024-01-07 NOTE — ED Provider Notes (Signed)
 Facility Based Crisis Admission H&P  Date: 01/07/24 Patient Name: Kathy Guerrero MRN: [id number] Chief Complaint: Transferred to [facility] to from [facility]  Diagnoses:  Final diagnoses:  Involuntary commitment  Adjustment disorder with mixed anxiety and depressed mood    HPI: Kathy Guerrero is a 33 year old female patient with a documented history of alcohol abuse and GAD and a reported history of postpartum depression, and anxiety who presented to the [facility] behavioral health urgent care under involuntary commitment after she was transferred from [facility] emergency department  under IVC and was recommended to be admitted to the facility based crisis center.  Patient petitioned by her parent Nijay Mcamis. Per IVC, "Respondent has been diagnosed with chemical imbalance, depression, anxiety, alcoholism; respondent has been prescribed medications; respondent was committed a few weeks ago to [facility]; respondent drinks alcohol daily; respondent appears to be hallucinating and hearing voices; respondent broke the front door glass this evening, respondent is running through the neighborhood threatening to kill the neighbors and her family; respondent is a danger to self; respondent is a danger to others."   Patient states that this is the third time that she has had a heated argument with her parents. She states that the argument started after she went to the store and bought a bottle of wine and was sitting on the porch drinking when her parents returned home and saw her they said she was sad and drinking. She states that she had been sober from drinking alcohol for one month and wanted to celebrate. She states that her parents locked her out of the house. She started knocking hard on the door so that she could get her phone and other items and ended knocking too hard and cracked the door. She states that her father called the police and had her removed. She denies the  allegations pertaining to the IVC and states that she did not threaten to kill anyone and would never harm anyone because of her daughter. She reports heavy alcohol use since COVID and states that she was drinking 1-2 bottles of wine daily. She states that at that time she had lost lost her job, broke up with her boyfriend of 9 years and was going through postpartum depression. She reports that she has stopped drinking alcohol for the past month up until the day she decided to buy a bottle of wine to celebrate.  She denies using illicit drugs other than smoking weed recreational. She states that she has not smoked much weed since her daughter was born. She reports taking gummies sometimes to help her sleep at night. She reports that she recently moved in with her parents and has been living there temporarily while her ex-boyfriend tries to sell the home that they had bought together. She states that her 46-year-old daughter is currently staying with her father as they have a 50-50 custody. She denies outpatient psychiatry or counseling. She states that she is interested in trying to get a therapist. She reports 1 inpatient psychiatric hospitalization at [facility] 1 month ago. Per chart review, patient was hospitalized at [facility] on 12/13/2023 and was prescribed mirtazapine 15 mg QHS, propranolol  10 mg TID, Risperdal 0.5 mg daily. She states that she has stopped taking the Prozac  a month ago, not sure if it was working. She denies taking other prescribed medications other than mirtazapine and propranolol  for anxiety.  On evaluation, patient is alert and oriented x 4. Her thought process is linear.  Thought content is negative for SI/HI/AVH. Objectively, there is no evidence of acute psychosis. Her mood is anxious and affect is congruent. She has fair eye contact. She is cooperative and does not appear to be in acute distress. She denies feeling depressed but admits to occasional sadness due to  trying to find a job and a place to stay for her daughter and dog. She scored a 6 on the PHQ-9. She states that most of her symptoms are anxiety related as she is constantly worrying, shaking and has panic attacks. She denies alcohol withdrawal symptoms.   PHQ 2-9:  Flowsheet Row ED from 01/07/2024 in [facility] Video Visit from 02/08/2019 in [facility]  Thoughts that you would be better off dead, or of hurting yourself in some way Not at all Not at all  PHQ-9 Total Score 6 3       Flowsheet Row ED from 01/07/2024 in [facility] Most recent reading at 01/07/2024 10:26 AM ED from 01/06/2024 in [facility] Most recent reading at 01/06/2024 11:46 PM ED from 01/06/2024 in [facility] Most recent reading at 01/06/2024 10:09 PM  C-SSRS RISK CATEGORY No Risk No Risk No Risk       Screenings    Flowsheet Row Most Recent Value  CIWA-Ar Total 5       Total Time spent with patient: 45 minutes  Musculoskeletal  Strength & Muscle Tone: within normal limits Gait & Station: normal Patient leans: N/A  Psychiatric Specialty Exam  Presentation General Appearance:  Appropriate for Environment  Eye Contact: Fair  Speech: Clear and Coherent  Speech Volume: Normal  Handedness: Right   Mood and Affect  Mood: Anxious  Affect: Congruent   Thought Process  Thought Processes: Coherent  Descriptions of Associations:Intact  Orientation:Full (Time, Place and Person)  Thought Content:WDL    Hallucinations:Hallucinations: None  Ideas of Reference:None  Suicidal Thoughts:Suicidal Thoughts: No  Homicidal Thoughts:Homicidal Thoughts: No   Sensorium  Memory: Immediate Fair; Remote Fair; Recent Fair  Judgment: Intact  Insight: Present   Executive Functions  Concentration: Fair  Attention Span: Fair  Recall: [employer]  of Knowledge: Fair  Language: Fair   Psychomotor Activity  Psychomotor Activity: Psychomotor Activity: Normal   Assets  Assets: Communication Skills; Desire for Improvement; Housing; Social Support; Physical Health; Leisure Time   Sleep  Sleep: Poor  Nutritional Assessment (For OBS and [facility] admissions only) Has the patient had a weight loss or gain of 10 pounds or more in the last 3 months?: No Has the patient had a decrease in food intake/or appetite?: No Does the patient have dental problems?: No Does the patient have eating habits or behaviors that may be indicators of an eating disorder including binging or inducing vomiting?: No Has the patient recently lost weight without trying?: 0 Has the patient been eating poorly because of a decreased appetite?: 0 Malnutrition Screening Tool Score: 0    Physical Exam Cardiovascular:     Rate and Rhythm: Normal rate.  Pulmonary:     Effort: Pulmonary effort is normal.  Musculoskeletal:        General: Normal range of motion.     Cervical back: Normal range of motion.  Neurological:     Mental Status: She is alert and oriented to person, place, and time.    Review of Systems  Constitutional: Negative.   Eyes: Negative.   Respiratory: Negative.  Cardiovascular: Negative.   Gastrointestinal: Negative.   Genitourinary: Negative.   Musculoskeletal: Negative.   Neurological: Negative.   Endo/Heme/Allergies: Negative.     Blood pressure 124/60, pulse 84, temperature 98.4 F (36.9 C), temperature source Oral, resp. rate 18, SpO2 100%. There is no height or weight on file to calculate BMI.  Past Psychiatric History: a documented history of alcohol abuse and GAD and a reported history of postpartum depression, and anxiety. She reports 1 inpatient psychiatric hospitalization at [facility] 1 month ago. Per chart review, patient was hospitalized at [facility] on 12/13/2023 and was prescribed mirtazapine 15  mg QHS, propranolol  10 mg TID, Risperdal 0.5 mg daily.   Is the patient at risk to self? No  Has the patient been a risk to self in the past 6 months? No .    Has the patient been a risk to self within the distant past? No   Is the patient a risk to others? Yes   Has the patient been a risk to others in the past 6 months? Yes   Has the patient been a risk to others within the distant past? Unknown  Past Medical History: No reported history.   Family History: No reported history.  Social History: Patient resides with her parents. Patient patient has 1 daughter. Patient is unemployed.  Last Labs:  Admission on 01/05/2024, Discharged on 01/06/2024  Component Date Value Ref Range Status   Sodium 01/06/2024 137  135 - 145 mmol/L Final   Potassium 01/06/2024 4.0  3.5 - 5.1 mmol/L Final   Chloride 01/06/2024 107  98 - 111 mmol/L Final   CO2 01/06/2024 20 (L)  22 - 32 mmol/L Final   Glucose, Bld 01/06/2024 116 (H)  70 - 99 mg/dL Final   Glucose reference range applies only to samples taken after fasting for at least 8 hours.   BUN 01/06/2024 <5 (L)  6 - 20 mg/dL Final   Creatinine, Ser 01/06/2024 0.46  0.44 - 1.00 mg/dL Final   Calcium 03/47/4259 9.6  8.9 - 10.3 mg/dL Final   Total Protein 56/38/7564 7.6  6.5 - 8.1 g/dL Final   Albumin 33/29/5188 4.2  3.5 - 5.0 g/dL Final   AST 41/66/0630 33  15 - 41 U/L Final   ALT 01/06/2024 48 (H)  0 - 44 U/L Final   Alkaline Phosphatase 01/06/2024 59  38 - 126 U/L Final   Total Bilirubin 01/06/2024 0.9  0.0 - 1.2 mg/dL Final   GFR, Estimated 01/06/2024 >60  >60 mL/min Final   Comment: (NOTE) Calculated using the CKD-EPI Creatinine Equation (2021)    Anion gap 01/06/2024 10  5 - 15 Final   Performed at [facility], [address], [place], [place] [address]   Alcohol, Ethyl (B) 01/06/2024 297 (H)  <15 mg/dL Final   Comment: Please note change in reference range. (NOTE) For medical purposes only. Performed at [facility], [address] [place], [place] [address]    Opiates 01/06/2024 NONE DETECTED  NONE DETECTED Final   Cocaine 01/06/2024 NONE DETECTED  NONE DETECTED Final   Benzodiazepines 01/06/2024 NONE DETECTED  NONE DETECTED Final   Amphetamines 01/06/2024 NONE DETECTED  NONE DETECTED Final   Tetrahydrocannabinol 01/06/2024 POSITIVE (A)  NONE DETECTED Final   Barbiturates 01/06/2024 NONE DETECTED  NONE DETECTED Final   Comment: (NOTE) DRUG SCREEN FOR MEDICAL PURPOSES ONLY.  IF CONFIRMATION IS NEEDED FOR ANY PURPOSE, NOTIFY LAB WITHIN 5 DAYS.  LOWEST DETECTABLE LIMITS FOR URINE DRUG SCREEN Drug Class                     Cutoff (ng/mL) Amphetamine and metabolites    1000 Barbiturate and metabolites    200 Benzodiazepine                 200 Opiates and metabolites        300 Cocaine and metabolites        300 THC                            50 Performed at [facility], [address] [place], [place] [address]    WBC 01/06/2024 7.3  4.0 - 10.5 K/uL Final   RBC 01/06/2024 4.56  3.87 - 5.11 MIL/uL Final   Hemoglobin 01/06/2024 15.0  12.0 - 15.0 g/dL Final   HCT 19/14/7829 43.3  36.0 - 46.0 % Final   MCV 01/06/2024 95.0  80.0 - 100.0 fL Final   MCH 01/06/2024 32.9  26.0 - 34.0 pg Final   MCHC 01/06/2024 34.6  30.0 - 36.0 g/dL Final   RDW 56/21/3086 11.5  11.5 - 15.5 % Final   Platelets 01/06/2024 362  150 - 400 K/uL Final   nRBC 01/06/2024 0.0  0.0 - 0.2 % Final   Neutrophils Relative % 01/06/2024 61  % Final   Neutro Abs 01/06/2024 4.5  1.7 - 7.7 K/uL Final   Lymphocytes Relative 01/06/2024 28  % Final   Lymphs Abs 01/06/2024 2.0  0.7 - 4.0 K/uL Final   Monocytes Relative 01/06/2024 9  % Final   Monocytes Absolute 01/06/2024 0.6  0.1 - 1.0 K/uL Final   Eosinophils Relative 01/06/2024 1  % Final   Eosinophils Absolute 01/06/2024 0.1  0.0 - 0.5 K/uL Final   Basophils Relative 01/06/2024 0  % Final   Basophils Absolute 01/06/2024 0.0  0.0 - 0.1 K/uL  Final   Immature Granulocytes 01/06/2024 1  % Final   Abs Immature Granulocytes 01/06/2024 0.04  0.00 - 0.07 K/uL Final   Performed at [facility], [address], [place], [place] [address]   Preg, Serum 01/06/2024 NEGATIVE  NEGATIVE Final   Comment:        THE SENSITIVITY OF THIS METHODOLOGY IS >10 mIU/mL. Performed at [facility], [address] [place], [place] [address]     Allergies: Dilantin [phenytoin]  Medications:  Facility Ordered Medications  Medication   chlordiazePOXIDE (LIBRIUM) capsule 25 mg   FLUoxetine  (PROZAC ) capsule 10 mg   nicotine (NICODERM CQ - dosed in mg/24 hours) patch 21 mg   acetaminophen  (TYLENOL ) tablet 650 mg   alum & mag hydroxide-simeth (MAALOX/MYLANTA) 200-200-20 MG/5ML suspension 30 mL   magnesium hydroxide (MILK OF MAGNESIA) suspension 30 mL   haloperidol (HALDOL) tablet 5 mg   And   diphenhydrAMINE  (BENADRYL ) capsule 50 mg   haloperidol lactate (HALDOL) injection 5 mg   And   diphenhydrAMINE  (BENADRYL ) injection 50 mg   And   LORazepam (ATIVAN) injection 2 mg   haloperidol lactate (HALDOL) injection 10 mg   And   diphenhydrAMINE  (BENADRYL ) injection 50 mg   And   LORazepam (ATIVAN) injection 2 mg   multivitamin with minerals tablet 1 tablet   chlordiazePOXIDE (LIBRIUM) capsule 25 mg   hydrOXYzine (ATARAX) tablet 25 mg   loperamide (IMODIUM) capsule 2-4 mg   ondansetron  (ZOFRAN -ODT) disintegrating  tablet 4 mg   thiamine (VITAMIN B1) tablet 100 mg   PTA Medications  Medication Sig   olmesartan  (BENICAR ) 20 MG tablet Take 1 tablet (20 mg total) by mouth daily. (Patient not taking: Reported on 01/06/2024)   propranolol  (INDERAL ) 10 MG tablet Take 1 tablet (10 mg total) by mouth 3 (three) times daily as needed (For anxiety.).   FLUoxetine  (PROZAC ) 10 MG capsule TAKE 1 CAPSULE BY MOUTH EVERY DAY   mirtazapine (REMERON) 15 MG tablet Take 15 mg by mouth at bedtime.   Nicotine (NICODERM CQ TD) Place  1 patch onto the skin daily as needed (for vaping cessation).   nicotine polacrilex (NICORETTE) 4 MG gum Take 4 mg by mouth as needed (for vaping/smoking cessation).   Aspirin-Salicylamide-Caffeine (BC HEADACHE POWDER PO) Take 1 packet by mouth 2 (two) times daily as needed (for headaches).    Long Term Goals: Improvement in symptoms so as ready for discharge  Short Term Goals: Patient will verbalize feelings in meetings with treatment team members., Patient will attend at least of 50% of the groups daily., Pt will complete the PHQ9 on admission, day 3 and discharge., and Patient will take medications as prescribed daily.  Medical Decision Making  Patient admitted to the facility based crisis Center under involuntary commitment for mood stabilization. Upon discharge patient will like to follow up with outpatient psychiatry and counseling.  Medication regimen DC Prozac  Restart mirtazapine 15 mg nightly for sleep, anxiety and depression Restart propranolol  10 mg p.o. 3 times daily for anxiety Continue Librium 25 mg as needed every 6 hours as needed for CIWA greater than 10  Labs reviewed: ALT slightly elevated at 48.  BAL on arrival was 297. UDS positive for [facility]   Recommendations  Based on my evaluation the patient does not appear to have an emergency medical condition.  Hani Campusano L, NP 01/07/24  1:15 PM

## 2024-01-07 NOTE — BHH Group Notes (Signed)
 SPIRITUALITY GROUP NOTE  Spirituality group facilitated by Chaplain Neviah Braud, MDiv, BCC.  Group Description:  Group focused on topic of hope.  Patients participated in facilitated discussion around topic, connecting with one another around experiences and definitions for hope.  Group members engaged with visual explorer photos, reflecting on what hope looks like for them today.  Group engaged in discussion around how their definitions of hope are present today in hospital.   Modalities: Psycho-social ed, Adlerian, Narrative, MI Patient Progress:  Present throughout group.  Actively engaged in topic.  Identified "Catching" herself when she is in a negative spiral, re-framing thinking, and staying connected to what she values (her child) as hopeful.  Noted feeling connected to hope through meditation, time outside, and music.

## 2024-01-07 NOTE — ED Notes (Signed)
 Pt is in the dayroom composed and pleasant watching TV with other patients. Respirations are even and unlabored. NAD Will monitor for safety.

## 2024-01-07 NOTE — Discharge Instructions (Addendum)
 LOCAL SERVICE PROVIDERS OUTPATIENT SERVICES [facility]  [phone]  [facility] [phone]  [facility]) [phone]  [facility] [place]      [phone]  [facility])    [phone]  [facility]    [phone]   Non-Emergent / Urgent   [facility] [address] SECOND FLOOR [place], [place] [address] [phone] OUTPATIENT Walk-in information: Please note, all walk-ins are first come & first serve, with limited number of availability.   Please note that to be eligible for services you must bring: ID or a piece of mail with your name [facility] address   Therapist for therapy:  Monday & Wednesdays: Please ARRIVE at 7:15 AM for registration Will START at 8:00 AM Every 1st & 2nd Friday of the month: Please ARRIVE at 10:15 AM for registration Will START at 1 PM - 5 PM   Psychiatrist for medication management: Monday - Friday:  Please ARRIVE at 7:15 AM for registration Will START at 8:00 AM   Regretfully, due to limited availability, please be aware that you may not been seen on the same day as walk-in. Please consider making an appoint or try again. Thank you for your patience and understanding. ________________________________________________________   [facility] URGENT CARE:  [address] FIRST FLOOR.  [place], [place] [address].  [phone]   Mobile Crisis Response Teams Listed by counties in vicinity of [facility] providers [facility]  [facility] [phone] [facility]  [facility] [phone] [facility]  [facility] [phone] [facility]  [facility] [phone] [place]                 * [facility] [phone]                * [facility] [phone] [facility]  [facility]  [phone] [facility]  [phone] * [facility] [phone] ________________________________________________________  LCSW spoke with patient regarding plans at discharge. Patient aware of resources provided at the [facility]-Outpatient New Patient Assessment/Therapy Walk ins:. Instructions provided in AVS. Patient expressed appreciation for LCSW assistance with discharge planning. Patient reports feeling safe to discharge and reports having support from family when he/she returns home. No other needs were reported at this time. LCSW to sign off. Please inform if further LCSW needs arise prior to discharge.   Patient will have a new patient primary care follow up appointment scheduled with Almer Jacobson with [facility] [address] [phone] June 10, at 1:10. Will need to arrive 15 minutes early.

## 2024-01-07 NOTE — Group Note (Signed)
 Group Topic: Identity and Relationships  Group Date: 01/07/2024 Start Time: 2015 End Time: 2030 Facilitators: Soila Dunnings  Department: [facility]  Number of Participants: 5  Group Focus: acceptance, coping skills, feeling awareness/expression, healthy friendships, and reminiscence Treatment Modality:  Leisure Development Interventions utilized were leisure development Purpose: enhance coping skills, express feelings, and improve communication skills  Name: Kathy Guerrero Date of Birth: 27-Sep-1990  MR: [id number]    Level of Participation: active Quality of Participation: attentive and cooperative Interactions with others: positive  Mood/Affect: appropriate and positive Triggers (if applicable): n/a Cognition: coherent/clear Progress: Gaining insight Response: pt was very open and shared goals for recovery with other participants  Plan: patient will be encouraged to continue attending groups  Patients Problems:  Patient Active Problem List   Diagnosis Date Noted   Alcohol use disorder 01/07/2024   MDD (major depressive disorder) 01/06/2024   Primary hypertension 11/09/2022   Generalized anxiety disorder 11/09/2022   Vitamin D  deficiency 02/20/2019
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
## 2024-01-07 NOTE — ED Notes (Signed)
 Patient in the bedroom calm and sleeping. NAD Environment secured. Will keep monitoring for safety.

## 2024-01-07 NOTE — ED Notes (Signed)
 Patient transferred from [facility] to [facility] due to alcohol use. Calm, cooperative throughout interview process. Skin assessment completed. Oriented to unit. Meal and drink offered. Clean scrubs and hygiene items provided for shower. Patient alert & oriented x4. Denies intent to harm self or others when asked. Denies A/VH. Patient denies any physical complaints when asked. Anxiety reported to be at a 7/10. No acute distress noted. Support and encouragement provided. Routine safety checks conducted per facility protocol. Encouraged patient to notify staff if any thoughts of harm towards self or others arise. Patient verbalizes understanding and agreement.

## 2024-01-07 NOTE — Discharge Instructions (Signed)
Pt transferred to [facility]. 

## 2024-01-07 NOTE — ED Notes (Signed)
 Patient resting quietly in bed with eyes closed. Respirations equal and unlabored, skin warm and dry, NAD. Routine safety checks conducted according to facility protocol. Will continue to monitor for safety.

## 2024-01-07 NOTE — Group Note (Signed)
 Group Topic: Understanding Self  Group Date: 01/07/2024 Start Time: 1430 End Time: 1515 Facilitators: Dennis Fitting, NT  Department: [facility]  Number of Participants: 4  Group Focus: coping skills and relaxation Treatment Modality:  Psychoeducation Interventions utilized were exploration, patient education, story telling, and support Purpose: enhance coping skills, increase insight, and reinforce self-care  Name: Kathy Guerrero Date of Birth: 11/11/1990  MR: [id number]    Level of Participation: active Quality of Participation: attentive, cooperative, and engaged Interactions with others: gave feedback Mood/Affect: appropriate and positive Triggers (if applicable): N/A Cognition: coherent/clear Progress: Gaining insight Response: Patient discussed using gardening with her daughter as a coping skill and relaxation technique. She discussed using YouTube to relax at the end of a rough period. Plan: patient will be encouraged to attend groups and explore coping skills  Patients Problems:  Patient Active Problem List   Diagnosis Date Noted   Alcohol use disorder 01/07/2024   MDD (major depressive disorder) 01/06/2024   Primary hypertension 11/09/2022   Generalized anxiety disorder 11/09/2022   Vitamin D  deficiency 02/20/2019

## 2024-01-08 DIAGNOSIS — F4323 Adjustment disorder with mixed anxiety and depressed mood: Secondary | ICD-10-CM | POA: Diagnosis not present

## 2024-01-08 DIAGNOSIS — Z79899 Other long term (current) drug therapy: Secondary | ICD-10-CM | POA: Diagnosis not present

## 2024-01-08 DIAGNOSIS — R443 Hallucinations, unspecified: Secondary | ICD-10-CM | POA: Diagnosis not present

## 2024-01-08 NOTE — Group Note (Signed)
 Group Topic: Recovery Basics  Group Date: 01/08/2024 Start Time: 1210 End Time: 1320 Facilitators: Arlan Belling, RN  Department: [facility]  Number of Participants: 8  Group Focus: chemical dependency education Treatment Modality:  Behavior Modification Therapy Interventions utilized were patient education Purpose: express feelings, express irrational fears, improve communication skills, increase insight, regain self-worth, reinforce self-care, and relapse prevention strategies  Name: Kathy Guerrero Date of Birth: 06/30/1991  MR: [id number]    Level of Participation: minimal Quality of Participation: attentive Interactions with others: gave feedback Mood/Affect: appropriate Triggers (if applicable):   Cognition: coherent/clear Progress: Minimal Response:   Plan: follow-up needed  Patients Problems:  Patient Active Problem List   Diagnosis Date Noted   Alcohol use disorder 01/07/2024   MDD (major depressive disorder) 01/06/2024   Primary hypertension 11/09/2022   Generalized anxiety disorder 11/09/2022   Vitamin D  deficiency 02/20/2019

## 2024-01-08 NOTE — ED Provider Notes (Signed)
 Behavioral Health Progress Note  Date and Time: 01/08/2024 8:55 AM Name: Kathy Guerrero MRN:  [id number]  Subjective:  Kathy Guerrero is a 33 year old female patient with a documented history of alcohol abuse and GAD and a reported history of postpartum depression, and anxiety who presented to the [facility] behavioral health urgent care under involuntary commitment after she was transferred from [facility] emergency department under IVC and was recommended to be admitted to the facility-based crisis center.   Patient petitioned by her parent Deberah Adolf. Per IVC, "Respondent has been diagnosed with chemical imbalance, depression, anxiety, alcoholism; respondent has been prescribed medications; respondent was committed a few weeks ago to [facility]; respondent drinks alcohol daily; respondent appears to be hallucinating and hearing voices; respondent broke the front door glass this evening, respondent is running through the neighborhood threatening to kill the neighbors and her family; respondent is a danger to self; respondent is a danger to others."   On reevaluation, patient is lying down in bed in no acute distress. She is alert and oriented x 4. Her thought process is linear. Thought content is negative for SI/HI/AVH. Objectively, no signs of acute psychosis. Mood is anxious and affect is congruent. She reports feeling mostly anxious in the morning and then it goes away. She states that the propranolol  helps with her anxiety. She denies depressive symptoms, no sadness, hopelessness, worthlessness, isolation, crying spells, anhedonia, decreased energy or decreased motivation. She reports fair sleep. She reports a good appetite. She denies alcohol withdrawal symptoms and states that she only drank that one night. She denies physical pain. She denies medication side effects. Upon discharge, she is interested in establishing outpatient counseling close to her parents house  here in [facility].    Diagnosis:  Final diagnoses:  Involuntary commitment  Adjustment disorder with mixed anxiety and depressed mood    Total Time spent with patient: 20 minutes  Past Psychiatric History: a documented history of alcohol abuse and GAD and a reported history of postpartum depression, and anxiety. She reports 1 inpatient psychiatric hospitalization at [facility] 1 month ago. Per chart review, patient was hospitalized at [facility] on 12/13/2023 and was prescribed mirtazapine  15 mg QHS, propranolol  10 mg TID, Risperdal 0.5 mg daily.    Past Medical History: No reported history.    Family History: No reported history.   Social History: Patient resides with her parents. Patient patient has 1 daughter. Patient is unemployed.  Current Medications:  Current Facility-Administered Medications  Medication Dose Route Frequency Provider Last Rate Last Admin   acetaminophen  (TYLENOL ) tablet 650 mg  650 mg Oral Q6H PRN Brent, Amanda C, NP       alum & mag hydroxide-simeth (MAALOX/MYLANTA) 200-200-20 MG/5ML suspension 30 mL  30 mL Oral Q4H PRN Brent, Amanda C, NP       chlordiazePOXIDE  (LIBRIUM ) capsule 25 mg  25 mg Oral Q6H PRN Brent, Amanda C, NP       haloperidol  (HALDOL ) tablet 5 mg  5 mg Oral TID PRN Brent, Amanda C, NP       And   diphenhydrAMINE  (BENADRYL ) capsule 50 mg  50 mg Oral TID PRN Brent, Amanda C, NP       haloperidol  lactate (HALDOL ) injection 5 mg  5 mg Intramuscular TID PRN Brent, Amanda C, NP       And   diphenhydrAMINE  (BENADRYL ) injection 50 mg  50 mg Intramuscular TID PRN Brent, Amanda C, NP  And   LORazepam  (ATIVAN ) injection 2 mg  2 mg Intramuscular TID PRN Brent, Amanda C, NP       haloperidol  lactate (HALDOL ) injection 10 mg  10 mg Intramuscular TID PRN Brent, Amanda C, NP       And   diphenhydrAMINE  (BENADRYL ) injection 50 mg  50 mg Intramuscular TID PRN Brent, Amanda C, NP       And   LORazepam  (ATIVAN ) injection 2 mg  2  mg Intramuscular TID PRN Brent, Amanda C, NP       hydrOXYzine  (ATARAX ) tablet 25 mg  25 mg Oral Q6H PRN Brent, Amanda C, NP   25 mg at 01/07/24 1049   loperamide  (IMODIUM ) capsule 2-4 mg  2-4 mg Oral PRN Brent, Amanda C, NP       magnesium  hydroxide (MILK OF MAGNESIA) suspension 30 mL  30 mL Oral Daily PRN Brent, Amanda C, NP       mirtazapine  (REMERON ) tablet 15 mg  15 mg Oral QHS Emilea Goga L, NP   15 mg at 01/07/24 2120   multivitamin with minerals tablet 1 tablet  1 tablet Oral Daily Brent, Amanda C, NP   1 tablet at 01/07/24 1049   nicotine  (NICODERM CQ  - dosed in mg/24 hours) patch 21 mg  21 mg Transdermal Daily Brent, Amanda C, NP       ondansetron  (ZOFRAN -ODT) disintegrating tablet 4 mg  4 mg Oral Q6H PRN Brent, Amanda C, NP       propranolol  (INDERAL ) tablet 10 mg  10 mg Oral TID PRN Zeda Gangwer L, NP       thiamine  (VITAMIN B1) tablet 100 mg  100 mg Oral Daily Brent, Amanda C, NP   100 mg at 01/07/24 1049   Current Outpatient Medications  Medication Sig Dispense Refill   Aspirin-Salicylamide-Caffeine (BC HEADACHE POWDER PO) Take 1 packet by mouth 2 (two) times daily as needed (for headaches).     FLUoxetine  (PROZAC ) 10 MG capsule TAKE 1 CAPSULE BY MOUTH EVERY DAY 30 capsule 1   mirtazapine  (REMERON ) 15 MG tablet Take 15 mg by mouth at bedtime.     Nicotine  (NICODERM CQ  TD) Place 1 patch onto the skin daily as needed (for vaping cessation).     nicotine  polacrilex (NICORETTE ) 4 MG gum Take 4 mg by mouth as needed (for vaping/smoking cessation).     olmesartan  (BENICAR ) 20 MG tablet Take 1 tablet (20 mg total) by mouth daily. (Patient not taking: Reported on 01/06/2024) 30 tablet 5   propranolol  (INDERAL ) 10 MG tablet Take 1 tablet (10 mg total) by mouth 3 (three) times daily as needed (For anxiety.). 30 tablet 0    Labs  Lab Results:  Admission on 01/05/2024, Discharged on 01/06/2024  Component Date Value Ref Range Status   Sodium 01/06/2024 137  135 - 145 mmol/L Final    Potassium 01/06/2024 4.0  3.5 - 5.1 mmol/L Final   Chloride 01/06/2024 107  98 - 111 mmol/L Final   CO2 01/06/2024 20 (L)  22 - 32 mmol/L Final   Glucose, Bld 01/06/2024 116 (H)  70 - 99 mg/dL Final   Glucose reference range applies only to samples taken after fasting for at least 8 hours.   BUN 01/06/2024 <5 (L)  6 - 20 mg/dL Final   Creatinine, Ser 01/06/2024 0.46  0.44 - 1.00 mg/dL Final   Calcium 16/05/9603 9.6  8.9 - 10.3 mg/dL Final   Total Protein 54/04/8118 7.6  6.5 - 8.1 g/dL  Final   Albumin 01/06/2024 4.2  3.5 - 5.0 g/dL Final   AST 40/98/1191 33  15 - 41 U/L Final   ALT 01/06/2024 48 (H)  0 - 44 U/L Final   Alkaline Phosphatase 01/06/2024 59  38 - 126 U/L Final   Total Bilirubin 01/06/2024 0.9  0.0 - 1.2 mg/dL Final   GFR, Estimated 01/06/2024 >60  >60 mL/min Final   Comment: (NOTE) Calculated using the CKD-EPI Creatinine Equation (2021)    Anion gap 01/06/2024 10  5 - 15 Final   Performed at [facility], [address], [place], [place] [address]   Alcohol, Ethyl (B) 01/06/2024 297 (H)  <15 mg/dL Final   Comment: Please note change in reference range. (NOTE) For medical purposes only. Performed at [facility], [address] [place], [place] [address]    Opiates 01/06/2024 NONE DETECTED  NONE DETECTED Final   Cocaine 01/06/2024 NONE DETECTED  NONE DETECTED Final   Benzodiazepines 01/06/2024 NONE DETECTED  NONE DETECTED Final   Amphetamines 01/06/2024 NONE DETECTED  NONE DETECTED Final   Tetrahydrocannabinol 01/06/2024 POSITIVE (A)  NONE DETECTED Final   Barbiturates 01/06/2024 NONE DETECTED  NONE DETECTED Final   Comment: (NOTE) DRUG SCREEN FOR MEDICAL PURPOSES ONLY.  IF CONFIRMATION IS NEEDED FOR ANY PURPOSE, NOTIFY LAB WITHIN 5 DAYS.  LOWEST DETECTABLE LIMITS FOR URINE DRUG SCREEN Drug Class                     Cutoff (ng/mL) Amphetamine and metabolites    1000 Barbiturate and metabolites    200 Benzodiazepine                  200 Opiates and metabolites        300 Cocaine and metabolites        300 THC                            50 Performed at [facility], [address] [place], [place] [address]    WBC 01/06/2024 7.3  4.0 - 10.5 K/uL Final   RBC 01/06/2024 4.56  3.87 - 5.11 MIL/uL Final   Hemoglobin 01/06/2024 15.0  12.0 - 15.0 g/dL Final   HCT 84/69/6295 43.3  36.0 - 46.0 % Final   MCV 01/06/2024 95.0  80.0 - 100.0 fL Final   MCH 01/06/2024 32.9  26.0 - 34.0 pg Final   MCHC 01/06/2024 34.6  30.0 - 36.0 g/dL Final   RDW 28/41/3244 11.5  11.5 - 15.5 % Final   Platelets 01/06/2024 362  150 - 400 K/uL Final   nRBC 01/06/2024 0.0  0.0 - 0.2 % Final   Neutrophils Relative % 01/06/2024 61  % Final   Neutro Abs 01/06/2024 4.5  1.7 - 7.7 K/uL Final   Lymphocytes Relative 01/06/2024 28  % Final   Lymphs Abs 01/06/2024 2.0  0.7 - 4.0 K/uL Final   Monocytes Relative 01/06/2024 9  % Final   Monocytes Absolute 01/06/2024 0.6  0.1 - 1.0 K/uL Final   Eosinophils Relative 01/06/2024 1  % Final   Eosinophils Absolute 01/06/2024 0.1  0.0 - 0.5 K/uL Final   Basophils Relative 01/06/2024 0  % Final   Basophils Absolute 01/06/2024 0.0  0.0 - 0.1 K/uL Final   Immature Granulocytes 01/06/2024 1  % Final   Abs Immature Granulocytes 01/06/2024 0.04  0.00 - 0.07 K/uL Final  Performed at [facility], [address], [place], [place] [address]   Preg, Serum 01/06/2024 NEGATIVE  NEGATIVE Final   Comment:        THE SENSITIVITY OF THIS METHODOLOGY IS >10 mIU/mL. Performed at [facility], [address] [place], [place] [address]     Blood Alcohol level:  Lab Results  Component Value Date   ETH 297 (H) 01/06/2024    Metabolic Disorder Labs: Lab Results  Component Value Date   HGBA1C 5.0 02/15/2019   No results found for: "PROLACTIN" No results found for: "CHOL", "TRIG", "HDL", "CHOLHDL", "VLDL", "LDLCALC"  Therapeutic Lab Levels: No results  found for: "LITHIUM" No results found for: "VALPROATE" No results found for: "CBMZ"  Physical Findings   AUDIT    Flowsheet Row ED from 01/07/2024 in [facility]  Alcohol Use Disorder Identification Test Final Score (AUDIT) 11      GAD-7    Flowsheet Row Office Visit from 11/09/2022 in [facility] [place] Video Visit from 02/08/2019 in Center for [facility]  Total GAD-7 Score 16 3      PHQ2-9    Flowsheet Row ED from 01/07/2024 in [facility] Office Visit from 11/09/2022 in [facility] [place] Video Visit from 02/08/2019 in Center for [facility]  PHQ-2 Total Score 2 1 0  PHQ-9 Total Score 6 -- 3      Flowsheet Row ED from 01/07/2024 in [facility] Most recent reading at 01/07/2024 10:26 AM ED from 01/06/2024 in [facility] Most recent reading at 01/06/2024 11:46 PM ED from 01/06/2024 in [facility] Most recent reading at 01/06/2024 10:09 PM  C-SSRS RISK CATEGORY No Risk No Risk No Risk        Musculoskeletal  Strength & Muscle Tone: within normal limits Gait & Station: normal Patient leans: N/A  Psychiatric Specialty Exam  Presentation  General Appearance:  Appropriate for Environment  Eye Contact: Fair  Speech: Clear and Coherent  Speech Volume: Normal  Handedness: Right   Mood and Affect  Mood: Anxious  Affect: Congruent   Thought Process  Thought Processes: Coherent  Descriptions of Associations:Intact  Orientation:Full (Time, Place and Person)  Thought Content:WDL     Hallucinations:Hallucinations: None  Ideas of Reference:None  Suicidal Thoughts:Suicidal Thoughts: No  Homicidal Thoughts:Homicidal Thoughts: No   Sensorium  Memory: Immediate Fair; Recent Fair; Remote  Fair  Judgment: Intact  Insight: Present   Executive Functions  Concentration: Fair  Attention Span: Fair  Recall: [employer] of Knowledge: Fair  Language: Fair   Psychomotor Activity  Psychomotor Activity: Psychomotor Activity: Normal   Assets  Assets: Manufacturing systems engineer; Desire for Improvement; Housing   Sleep  Sleep: Fair   Physical Exam  Physical Exam Cardiovascular:     Rate and Rhythm: Normal rate.  Pulmonary:     Effort: Pulmonary effort is normal.  Musculoskeletal:        General: Normal range of motion.     Cervical back: Normal range of motion.  Neurological:     Mental Status: She is alert.    Review of Systems  Constitutional: Negative.   HENT: Negative.    Eyes: Negative.   Respiratory: Negative.    Cardiovascular: Negative.   Gastrointestinal: Negative.   Genitourinary: Negative.   Musculoskeletal: Negative.   Neurological: Negative.   Endo/Heme/Allergies: Negative.  Psychiatric/Behavioral:  The patient is nervous/anxious.    Blood pressure 124/80, pulse 89, temperature 98.2 F (36.8 C), temperature source Oral, resp. rate 18, SpO2 100%. There is no height or weight on file to calculate BMI.  Treatment Plan Summary:  Long Term Goals: Improvement in psychiatric symptoms. Establish outpatient services for psychiatry and counseling.   Short Term Goals: Patient will verbalize feelings in meetings with treatment team members., Patient will attend at least of 50% of the groups daily., Pt will complete the PHQ9 on admission, day 3 and discharge., and Patient will take medications as prescribed daily.   Patient admitted to the facility based crisis Center under involuntary commitment for mood stabilization. 2nd exam to be completed by Dr. Genita Keys.    Medication regimen mirtazapine  15 mg nightly for sleep, anxiety and depression propranolol  10 mg p.o. 3 times daily for anxiety Librium  25 mg as needed every 6 hours as needed for CIWA  greater than 10   Labs reviewed: ALT slightly elevated at 48.  BAL on arrival was 297. UDS positive for THC  Analuisa Tudor L, NP 01/08/2024 8:55 AM

## 2024-01-08 NOTE — Group Note (Signed)
 Group Topic: Relapse and Recovery  Group Date: 01/08/2024 Start Time: 2005 End Time: 2055 Facilitators: Soila Dunnings  Department: [facility]  Number of Participants: 7  Group Focus: acceptance, relapse prevention, self-awareness, and substance abuse education Treatment Modality:  Leisure Development Interventions utilized were leisure development, reminiscence, story telling, and support Purpose: enhance coping skills, express feelings, relapse prevention strategies, and trigger / craving management  Name: Kathy Guerrero Date of Birth: Apr 11, 1991  MR: [id number]    Level of Participation: active Quality of Participation: attentive and cooperative Interactions with others: gave feedback Mood/Affect: appropriate and positive Triggers (if applicable): n/a Cognition: coherent/clear Progress: Gaining insight Response: pt was very bright and positive during group and engaged in storytelling and reminiscence  Plan: patient will be encouraged to continue attending groups   Patients Problems:  Patient Active Problem List   Diagnosis Date Noted   Alcohol use disorder 01/07/2024   MDD (major depressive disorder) 01/06/2024   Primary hypertension 11/09/2022   Generalized anxiety disorder 11/09/2022   Vitamin D  deficiency 02/20/2019

## 2024-01-08 NOTE — ED Notes (Signed)
 Patient sleeping with eyes closed. NAD. Will continue to monitor for safety.

## 2024-01-08 NOTE — ED Notes (Signed)
 Patient is asleep in bed without issue or complaint.  No distress or signs of withdrawal at this time.  Will monitor.

## 2024-01-08 NOTE — ED Notes (Signed)
 Pt is in the dayroom watching TV with peers. Pt denies SI/HI/AVH. Pt endorsed anxiety and stated, "nothing serious"Pt has no further complain.No acute distress noted. Will continue to monitor for safety and provide support.

## 2024-01-08 NOTE — Group Note (Signed)
 Group Topic: Communication  Group Date: 01/08/2024 Start Time: 1000 End Time: 1045 Facilitators: Milan Alfred, NT MHT 2 Department: [facility]  Number of Participants: 2  Group Focus: communication Treatment Modality:  Cognitive Behavioral Therapy Interventions utilized were confrontation Purpose: improve communication skills  Name: TALIA HOHEISEL Date of Birth: June 08, 1991  MR: [id number]    Level of Participation: active Quality of Participation: attentive Interactions with others: gave feedback Mood/Affect: positive Triggers (if applicable): N/A Cognition: coherent/clear Progress: Moderate Response: Appropriate  Plan: patient will be encouraged to continue to discuss how she is feeling.  Patients Problems:  Patient Active Problem List   Diagnosis Date Noted   Alcohol use disorder 01/07/2024   MDD (major depressive disorder) 01/06/2024   Primary hypertension 11/09/2022   Generalized anxiety disorder 11/09/2022   Vitamin D  deficiency 02/20/2019

## 2024-01-08 NOTE — ED Notes (Signed)
 Patient has been awake and alert on unit .  She has been socializing with selected peers and has been coloring, doing puzzles and watching tv.  Patient is pleasant, calm and cooperative with care.  Patient without somatic distress or complaint.  No withdrawal.  She denies avh shi or plan.  Will monitor.

## 2024-01-09 DIAGNOSIS — Z79899 Other long term (current) drug therapy: Secondary | ICD-10-CM | POA: Diagnosis not present

## 2024-01-09 DIAGNOSIS — R443 Hallucinations, unspecified: Secondary | ICD-10-CM | POA: Diagnosis not present

## 2024-01-09 DIAGNOSIS — F4323 Adjustment disorder with mixed anxiety and depressed mood: Secondary | ICD-10-CM | POA: Diagnosis not present

## 2024-01-09 NOTE — ED Notes (Addendum)
 Patient alert & oriented x4. Denies intent to harm self or others when asked. Denies A/VH. Patient reports cramping in abdomen which she states is minimal, scoring a 2 on the FACES scale "hurts a little bit". Patient states she doesn't need anything for it at this time but would alert staff if that changes. Scheduled medications administered with no complications. No acute distress noted. Support and encouragement provided. Routine safety checks conducted per facility protocol. Encouraged patient to notify staff if any thoughts of harm towards self or others arise. Patient verbalizes understanding and agreement.

## 2024-01-09 NOTE — ED Notes (Signed)
 Patient is sleeping. Respirations equal and unlabored, skin warm and dry. No change in assessment or acuity. Routine safety checks conducted according to facility protocol. Will continue to monitor for safety.

## 2024-01-09 NOTE — Group Note (Signed)
 Group Topic: Social Support  Group Date: 01/09/2024 Start Time: 1845 End Time: 1853 Facilitators: Lithzy Bernard, [place]us Knoll, RN  Department: [facility]  Number of Participants: 1  Group Focus: check in Treatment Modality:  Individual Therapy Interventions utilized were support Purpose: express feelings  Name: Kathy Guerrero Date of Birth: October 25, 1990  MR: [id number]    Level of Participation: active Quality of Participation: attentive and cooperative Interactions with others: gave feedback Mood/Affect: appropriate and brightens with interaction Triggers (if applicable): None identified at this time Cognition: coherent/clear and logical Progress: Gaining insight Response: Patient voices no specific complaints at this time. Patient has been in milieu throughout the day, interacting well with peers and staff. Plan: patient will be encouraged to continue to attend programing on the unit  Patients Problems:  Patient Active Problem List   Diagnosis Date Noted   Alcohol use disorder 01/07/2024   MDD (major depressive disorder) 01/06/2024   Primary hypertension 11/09/2022   Generalized anxiety disorder 11/09/2022   Vitamin D  deficiency 02/20/2019

## 2024-01-09 NOTE — ED Notes (Signed)
 Patient sitting in dayroom interacting with peers. No acute distress noted. No concerns voiced. Informed patient to notify staff with any needs or assistance. Patient verbalized understanding or agreement. Safety checks in place per facility policy.

## 2024-01-09 NOTE — Group Note (Signed)
 Group Topic: Social Support  Group Date: 01/09/2024 Start Time: 1400 End Time: 1500 Facilitators: Esther Hem, NT  Department: [facility]  Number of Participants: 8  Group Focus: goals/reality orientation Treatment Modality:  Psychoeducation Interventions utilized were support Purpose: regain self-worth and relapse prevention strategies  Name: Kathy Guerrero Date of Birth: Dec 26, 1990  MR: [id number]    Level of Participation: active Quality of Participation: cooperative and engaged Interactions with others: gave feedback Mood/Affect: appropriate Triggers (if applicable): n/a Cognition: coherent/clear Progress: Gaining insight Response: PT participated in watering the garden Plan: patient will be encouraged to attend groups  Patients Problems:  Patient Active Problem List   Diagnosis Date Noted   Alcohol use disorder 01/07/2024   MDD (major depressive disorder) 01/06/2024   Primary hypertension 11/09/2022   Generalized anxiety disorder 11/09/2022   Vitamin D  deficiency 02/20/2019

## 2024-01-09 NOTE — ED Notes (Signed)
 Patient in bedroom, calm and composed. No acute distress noted. No concerns voiced. Informed patient to notify staff with any needs or assistance. Patient verbalized understanding or agreement. Safety checks in place per facility policy.

## 2024-01-09 NOTE — ED Notes (Signed)
 Pt is in the dayroom coloring when writer arrived on the unit. Upon assessment, pt informed writer how sad she is because her parent do not want her back with them. According to pt, she does not want to go to residential treatment facility, but rather requesting for IOP,where she can get a job and start working. Pt verbalized she will be fine with that decision, when writer asked pt if she believes that will be the best option for her. She reports she does not have her phone to call people who can help with accomodation, the phone is home with her parent and has called them to bring it over to her. Pt reports when the phone gets here, she will make  calls to friends who can help with accomodation. When writer asked pt about her daughter, pt replied " my daughter is not affected anyway with regards to the accomodation issue, she lives with the dad".  Pt denies SI/HI/AVH. Pt has no further complain.No acute distress noted. Will continue to monitor for safety and provide support.

## 2024-01-09 NOTE — ED Provider Notes (Signed)
 Behavioral Health Progress Note  Date and Time: 01/09/2024 1:42 PM Name: Kathy Guerrero MRN:  [id number]  Subjective:  Kathy Guerrero is a 33 year old female patient with a documented history of alcohol abuse and GAD and a reported history of postpartum depression, and anxiety who presented to the [facility] behavioral health urgent care under involuntary commitment after she was transferred from [facility] emergency department under IVC and was recommended to be admitted to the facility-based crisis center. BAL was 297. UDS pos for THC.   Patient petitioned by her parent Makia Bossi. Per IVC, "Respondent has been diagnosed with chemical imbalance, depression, anxiety, alcoholism; respondent has been prescribed medications; respondent was committed a few weeks ago to [facility]; respondent drinks alcohol daily; respondent appears to be hallucinating and hearing voices; respondent broke the front door glass this evening, respondent is running through the neighborhood threatening to kill the neighbors and her family; respondent is a danger to self; respondent is a danger to others."   On evaluation today, patient reports that she is feeling a little "blah" and anxious because her period came on.She rates her anxiety 4/10 with 10 being the worst. She denies depressive symptoms. She reports fair sleep. She reports a good appetite. She denies SI/HI/AVH. Objectively, there were no signs of acute psychosis. She continues to deny alcohol withdrawal symptoms and states that she only drank that one night. When asked if she has spoken to her parents, she states that they have not called her. I discussed with the patient whether or not she is able to return back to her parents home. She states that this is the week for her to have custody of her daughter at her parents house and that she is hopeful they will let her stay there so she can be with her daughter. She states that she has come  to terms that she will have to follow there rules. The goal for today, is for the patient to contact her parents to see if she can return back home. Patient has been doing well on the unit participating in group therapy and following treatment plan. She has been compliant with scheduled medication regimen without any notable side effects. Her long-term goal is to get established with outpatient psychiatry and counseling close to her parents house in [place].   I followed up with patient this afternoon after she contacted her parents. She states that she does not think her father want her back in the home and does not feel safe with her being there. She states that she respects their decision.  She states that her daughter will have to stay with her (daughter's) father until she's able to figure out a plan. MHT is to allow patient to get numbers out of her cell phone so that she can call relatives and friends to inquire about a place to stay. Patient could benefit from a conference meeting to include parents, social worker, and psychiatric to discuss patient's progress and discharge planning.   Diagnosis:  Final diagnoses:  Involuntary commitment  Adjustment disorder with mixed anxiety and depressed mood    Total Time spent with patient: 20 minutes  Past Psychiatric History: a documented history of alcohol abuse and GAD and a reported history of postpartum depression, and anxiety. She reports 1 inpatient psychiatric hospitalization at [facility] 1 month ago. Per chart review, patient was hospitalized at [facility] on 12/13/2023 and was prescribed mirtazapine  15 mg QHS, propranolol  10 mg  TID, Risperdal 0.5 mg daily.    Past Medical History: No reported history.    Family History: No reported history.   Social History: Patient resides with her parents. Patient patient has 1 daughter. Patient is unemployed.  Current Medications:  Current Facility-Administered Medications   Medication Dose Route Frequency Provider Last Rate Last Admin   acetaminophen  (TYLENOL ) tablet 650 mg  650 mg Oral Q6H PRN Brent, Amanda C, NP       alum & mag hydroxide-simeth (MAALOX/MYLANTA) 200-200-20 MG/5ML suspension 30 mL  30 mL Oral Q4H PRN Brent, Amanda C, NP       chlordiazePOXIDE  (LIBRIUM ) capsule 25 mg  25 mg Oral Q6H PRN Brent, Amanda C, NP       haloperidol  (HALDOL ) tablet 5 mg  5 mg Oral TID PRN Brent, Amanda C, NP       And   diphenhydrAMINE  (BENADRYL ) capsule 50 mg  50 mg Oral TID PRN Brent, Amanda C, NP       haloperidol  lactate (HALDOL ) injection 5 mg  5 mg Intramuscular TID PRN Brent, Amanda C, NP       And   diphenhydrAMINE  (BENADRYL ) injection 50 mg  50 mg Intramuscular TID PRN Brent, Amanda C, NP       And   LORazepam  (ATIVAN ) injection 2 mg  2 mg Intramuscular TID PRN Brent, Amanda C, NP       haloperidol  lactate (HALDOL ) injection 10 mg  10 mg Intramuscular TID PRN Davia Erps, NP       And   diphenhydrAMINE  (BENADRYL ) injection 50 mg  50 mg Intramuscular TID PRN Brent, Amanda C, NP       And   LORazepam  (ATIVAN ) injection 2 mg  2 mg Intramuscular TID PRN Brent, Amanda C, NP       hydrOXYzine  (ATARAX ) tablet 25 mg  25 mg Oral Q6H PRN Brent, Amanda C, NP   25 mg at 01/07/24 1049   loperamide  (IMODIUM ) capsule 2-4 mg  2-4 mg Oral PRN Brent, Amanda C, NP       magnesium  hydroxide (MILK OF MAGNESIA) suspension 30 mL  30 mL Oral Daily PRN Brent, Amanda C, NP       mirtazapine  (REMERON ) tablet 15 mg  15 mg Oral QHS Yoskar Murrillo L, NP   15 mg at 01/08/24 2111   multivitamin with minerals tablet 1 tablet  1 tablet Oral Daily Brent, Amanda C, NP   1 tablet at 01/09/24 8119   nicotine  (NICODERM CQ  - dosed in mg/24 hours) patch 21 mg  21 mg Transdermal Daily Brent, Amanda C, NP   21 mg at 01/09/24 1478   ondansetron  (ZOFRAN -ODT) disintegrating tablet 4 mg  4 mg Oral Q6H PRN Brent, Amanda C, NP       propranolol  (INDERAL ) tablet 10 mg  10 mg Oral TID PRN Bo Rogue L,  NP       thiamine  (VITAMIN B1) tablet 100 mg  100 mg Oral Daily Brent, Amanda C, NP   100 mg at 01/09/24 0905   Current Outpatient Medications  Medication Sig Dispense Refill   Aspirin-Salicylamide-Caffeine (BC HEADACHE POWDER PO) Take 1 packet by mouth 2 (two) times daily as needed (for headaches).     FLUoxetine  (PROZAC ) 10 MG capsule TAKE 1 CAPSULE BY MOUTH EVERY DAY 30 capsule 1   mirtazapine  (REMERON ) 15 MG tablet Take 15 mg by mouth at bedtime.     Nicotine  (NICODERM CQ  TD) Place 1 patch onto the skin daily  as needed (for vaping cessation).     nicotine  polacrilex (NICORETTE ) 4 MG gum Take 4 mg by mouth as needed (for vaping/smoking cessation).     olmesartan  (BENICAR ) 20 MG tablet Take 1 tablet (20 mg total) by mouth daily. (Patient not taking: Reported on 01/06/2024) 30 tablet 5   propranolol  (INDERAL ) 10 MG tablet Take 1 tablet (10 mg total) by mouth 3 (three) times daily as needed (For anxiety.). 30 tablet 0    Labs  Lab Results:  Admission on 01/05/2024, Discharged on 01/06/2024  Component Date Value Ref Range Status   Sodium 01/06/2024 137  135 - 145 mmol/L Final   Potassium 01/06/2024 4.0  3.5 - 5.1 mmol/L Final   Chloride 01/06/2024 107  98 - 111 mmol/L Final   CO2 01/06/2024 20 (L)  22 - 32 mmol/L Final   Glucose, Bld 01/06/2024 116 (H)  70 - 99 mg/dL Final   Glucose reference range applies only to samples taken after fasting for at least 8 hours.   BUN 01/06/2024 <5 (L)  6 - 20 mg/dL Final   Creatinine, Ser 01/06/2024 0.46  0.44 - 1.00 mg/dL Final   Calcium 09/60/4540 9.6  8.9 - 10.3 mg/dL Final   Total Protein 98/06/9146 7.6  6.5 - 8.1 g/dL Final   Albumin 82/95/6213 4.2  3.5 - 5.0 g/dL Final   AST 08/65/7846 33  15 - 41 U/L Final   ALT 01/06/2024 48 (H)  0 - 44 U/L Final   Alkaline Phosphatase 01/06/2024 59  38 - 126 U/L Final   Total Bilirubin 01/06/2024 0.9  0.0 - 1.2 mg/dL Final   GFR, Estimated 01/06/2024 >60  >60 mL/min Final   Comment: (NOTE) Calculated using  the CKD-EPI Creatinine Equation (2021)    Anion gap 01/06/2024 10  5 - 15 Final   Performed at [facility], [address], [place], [place] [address]   Alcohol, Ethyl (B) 01/06/2024 297 (H)  <15 mg/dL Final   Comment: Please note change in reference range. (NOTE) For medical purposes only. Performed at [facility], [address], [place], [place] [address]    Opiates 01/06/2024 NONE DETECTED  NONE DETECTED Final   Cocaine 01/06/2024 NONE DETECTED  NONE DETECTED Final   Benzodiazepines 01/06/2024 NONE DETECTED  NONE DETECTED Final   Amphetamines 01/06/2024 NONE DETECTED  NONE DETECTED Final   Tetrahydrocannabinol 01/06/2024 POSITIVE (A)  NONE DETECTED Final   Barbiturates 01/06/2024 NONE DETECTED  NONE DETECTED Final   Comment: (NOTE) DRUG SCREEN FOR MEDICAL PURPOSES ONLY.  IF CONFIRMATION IS NEEDED FOR ANY PURPOSE, NOTIFY LAB WITHIN 5 DAYS.  LOWEST DETECTABLE LIMITS FOR URINE DRUG SCREEN Drug Class                     Cutoff (ng/mL) Amphetamine and metabolites    1000 Barbiturate and metabolites    200 Benzodiazepine                 200 Opiates and metabolites        300 Cocaine and metabolites        300 THC                            50 Performed at [facility], [address] [place], [place] [address]    WBC 01/06/2024 7.3  4.0 - 10.5 K/uL Final   RBC 01/06/2024 4.56  3.87 - 5.11  MIL/uL Final   Hemoglobin 01/06/2024 15.0  12.0 - 15.0 g/dL Final   HCT 51/88/4166 43.3  36.0 - 46.0 % Final   MCV 01/06/2024 95.0  80.0 - 100.0 fL Final   MCH 01/06/2024 32.9  26.0 - 34.0 pg Final   MCHC 01/06/2024 34.6  30.0 - 36.0 g/dL Final   RDW 02/21/1600 11.5  11.5 - 15.5 % Final   Platelets 01/06/2024 362  150 - 400 K/uL Final   nRBC 01/06/2024 0.0  0.0 - 0.2 % Final   Neutrophils Relative % 01/06/2024 61  % Final   Neutro Abs 01/06/2024 4.5  1.7 - 7.7 K/uL Final   Lymphocytes Relative 01/06/2024 28  % Final   Lymphs  Abs 01/06/2024 2.0  0.7 - 4.0 K/uL Final   Monocytes Relative 01/06/2024 9  % Final   Monocytes Absolute 01/06/2024 0.6  0.1 - 1.0 K/uL Final   Eosinophils Relative 01/06/2024 1  % Final   Eosinophils Absolute 01/06/2024 0.1  0.0 - 0.5 K/uL Final   Basophils Relative 01/06/2024 0  % Final   Basophils Absolute 01/06/2024 0.0  0.0 - 0.1 K/uL Final   Immature Granulocytes 01/06/2024 1  % Final   Abs Immature Granulocytes 01/06/2024 0.04  0.00 - 0.07 K/uL Final   Performed at [facility], [address] [place], [place] [address]   Preg, Serum 01/06/2024 NEGATIVE  NEGATIVE Final   Comment:        THE SENSITIVITY OF THIS METHODOLOGY IS >10 mIU/mL. Performed at [facility], [address], [place], [place] [address]     Blood Alcohol level:  Lab Results  Component Value Date   ETH 297 (H) 01/06/2024    Metabolic Disorder Labs: Lab Results  Component Value Date   HGBA1C 5.0 02/15/2019   No results found for: "PROLACTIN" No results found for: "CHOL", "TRIG", "HDL", "CHOLHDL", "VLDL", "LDLCALC"  Therapeutic Lab Levels: No results found for: "LITHIUM" No results found for: "VALPROATE" No results found for: "CBMZ"  Physical Findings   AUDIT    Flowsheet Row ED from 01/07/2024 in [facility]  Alcohol Use Disorder Identification Test Final Score (AUDIT) 11      GAD-7    Flowsheet Row Office Visit from 11/09/2022 in [facility] Video Visit from 02/08/2019 in Center for [facility]  Total GAD-7 Score 16 3      PHQ2-9    Flowsheet Row ED from 01/07/2024 in [facility] Office Visit from 11/09/2022 in [facility] Video Visit from 02/08/2019 in Center for [facility]  PHQ-2 Total Score 2 1 0  PHQ-9 Total Score 6 -- 3      Flowsheet Row ED from 01/07/2024 in [facility] Most recent reading at 01/07/2024 10:26 AM ED from 01/06/2024 in [facility] Most recent reading at 01/06/2024 11:46 PM ED from 01/06/2024 in [facility] Most recent reading at 01/06/2024 10:09 PM  C-SSRS RISK CATEGORY No Risk No Risk No Risk        Musculoskeletal  Strength & Muscle Tone: within normal limits Gait & Station: normal Patient leans: N/A  Psychiatric Specialty Exam  Presentation  General Appearance:  Appropriate for Environment  Eye Contact: Fair  Speech: Clear and Coherent  Speech Volume: Normal  Handedness: Right   Mood and Affect  Mood: Anxious  Affect: Congruent   Thought Process  Thought Processes: Coherent  Descriptions of Associations:Intact  Orientation:Full (Time, Place and Person)  Thought Content:Logical     Hallucinations:Hallucinations: None  Ideas of Reference:None  Suicidal Thoughts:Suicidal Thoughts: No  Homicidal Thoughts:Homicidal Thoughts: No   Sensorium  Memory: Immediate Fair; Recent Fair  Judgment: Intact  Insight: Present   Executive Functions  Concentration: Fair  Attention Span: Fair  Recall: [employer] of Knowledge: Fair  Language: Fair   Psychomotor Activity  Psychomotor Activity: Psychomotor Activity: Normal   Assets  Assets: Communication Skills; Desire for Improvement   Sleep  Sleep: Fair   Physical Exam  Physical Exam Cardiovascular:     Rate and Rhythm: Normal rate.  Pulmonary:     Effort: Pulmonary effort is normal.  Musculoskeletal:        General: Normal range of motion.     Cervical back: Normal range of motion.  Neurological:     Mental Status: She is alert.    Review of Systems  Constitutional: Negative.   HENT: Negative.    Eyes: Negative.   Respiratory: Negative.    Cardiovascular: Negative.   Gastrointestinal: Negative.   Genitourinary: Negative.   Musculoskeletal:  Negative.   Neurological: Negative.   Endo/Heme/Allergies: Negative.   Psychiatric/Behavioral:  The patient is nervous/anxious.    Blood pressure (!) 140/60, pulse 70, temperature 97.9 F (36.6 C), temperature source Oral, resp. rate 12, SpO2 100%. There is no height or weight on file to calculate BMI.  Treatment Plan Summary:  Long Term Goals: Improvement in psychiatric symptoms. Establish outpatient services for psychiatry and counseling.   Short Term Goals: Patient will verbalize feelings in meetings with treatment team members., Patient will attend at least of 50% of the groups daily., Pt will complete the PHQ9 on admission, day 3 and discharge., and Patient will take medications as prescribed daily.   Patient admitted to the facility based crisis Center under involuntary commitment for mood stabilization. 2nd exam to be completed by Dr. Genita Keys.    Medication regimen mirtazapine  15 mg nightly for sleep, anxiety and depression propranolol  10 mg p.o. 3 times daily for anxiety Librium  25 mg as needed every 6 hours as needed for CIWA greater than 10   Labs reviewed: ALT slightly elevated at 48. BAL on arrival was 297. UDS positive for THC  Deshawn Skelley L, NP 01/09/2024 1:42 PM

## 2024-01-10 DIAGNOSIS — Z79899 Other long term (current) drug therapy: Secondary | ICD-10-CM | POA: Diagnosis not present

## 2024-01-10 DIAGNOSIS — F4323 Adjustment disorder with mixed anxiety and depressed mood: Secondary | ICD-10-CM | POA: Diagnosis not present

## 2024-01-10 DIAGNOSIS — R443 Hallucinations, unspecified: Secondary | ICD-10-CM | POA: Diagnosis not present

## 2024-01-10 NOTE — ED Notes (Signed)
Patient is sleeping. Respirations equal and unlabored, skin warm and dry. No change in assessment or acuity. Routine safety checks conducted according to facility protocol. Will continue to monitor for safety.  Patient is sleeping. Respirations equal and unlabored, skin warm and dry. No change in assessment or acuity. Routine safety checks conducted according to facility protocol. Will continue to monitor for safety.

## 2024-01-10 NOTE — ED Notes (Signed)
 Pt continues to display appropriate behaviors.   Safety maintained thus far this shift.   Q 15 minutes observations for safety continue

## 2024-01-10 NOTE — Discharge Planning (Signed)
 Per discussion with provider, patient has agreed to remain another night while her parents discuss her return to their home verses her returning to a friends home. Patient has voiced her wishes to follow up with outpatient treatment with our walk in option on second floor. These instructions will be provided on her AVS at time of discharge. Will continue to follow.

## 2024-01-10 NOTE — Group Note (Signed)
 Group Topic: Social Support  Group Date: 01/10/2024 Start Time: 1645 End Time: 1700 Facilitators: Khady Vandenberg, Arbutus Knoll, RN; Walterine Gunther, RN  Department: [facility]  Number of Participants: 4  Group Focus: interpersonal group Treatment Modality:  Interpersonal Therapy Interventions utilized were support Purpose: enhance coping skills and express feelings  Name: Kathy Guerrero Date of Birth: 1990-10-08  MR: [id number]    Level of Participation: active Quality of Participation: attentive, cooperative, and engaged Interactions with others: gave feedback Mood/Affect: appropriate Triggers (if applicable): None identified at this time Cognition: coherent/clear and logical Progress: Gaining insight Response: Patient participated in group activity as well in conversation with peers. Plan: patient will be encouraged to continue to attend programing on the unit  Patients Problems:  Patient Active Problem List   Diagnosis Date Noted   Alcohol use disorder 01/07/2024   MDD (major depressive disorder) 01/06/2024   Primary hypertension 11/09/2022   Generalized anxiety disorder 11/09/2022   Vitamin D  deficiency 02/20/2019

## 2024-01-10 NOTE — ED Notes (Signed)
 Patient is sleeping. Respirations equal and unlabored, skin warm and dry. No change in assessment or acuity. Routine safety checks conducted according to facility protocol. Will continue to monitor for safety.

## 2024-01-10 NOTE — Group Note (Signed)
 Group Topic: Social Support  Group Date: 01/10/2024 Start Time: 2000 End Time: 2030 Facilitators: Wendall Halls B  Department: [facility]  Number of Participants: 7  Group Focus: abuse issues, activities of daily living skills, check in, daily focus, individual meeting, and safety plan Treatment Modality:  Leisure Development, Psychoeducation, and Solution-Focused Therapy Interventions utilized were patient education, problem solving, and support Purpose: enhance coping skills, express feelings, increase insight, reinforce self-care, and relapse prevention strategies  Name: Kathy Guerrero Date of Birth: 1990-11-18  MR: [id number]    Level of Participation: active Quality of Participation: attentive and cooperative Interactions with others: gave feedback Mood/Affect: appropriate Triggers (if applicable): NA Cognition: coherent/clear Progress: Gaining insight Response: NA Plan: patient will be encouraged to keep going to groups.   Patients Problems:  Patient Active Problem List   Diagnosis Date Noted   Alcohol use disorder 01/07/2024   MDD (major depressive disorder) 01/06/2024   Primary hypertension 11/09/2022   Generalized anxiety disorder 11/09/2022   Vitamin D  deficiency 02/20/2019

## 2024-01-10 NOTE — ED Provider Notes (Signed)
 Behavioral Health Progress Note  Date and Time: 01/10/2024 11:26 AM Name: Kathy Guerrero MRN:  [id number]  Subjective:  "My parents said I may not go back home"  Diagnosis:  Final diagnoses:  Involuntary commitment  Adjustment disorder with mixed anxiety and depressed mood    CHANDELL ATTRIDGE is a 33 year old female patient with a documented history of alcohol abuse and GAD and a reported history of postpartum depression, and anxiety who presented to the [facility] behavioral health urgent care under involuntary commitment after she was transferred from [facility] emergency department under IVC and was recommended to be admitted to the facility-based crisis center. BAL was 297. UDS pos for THC.  The IVC stated "Respondent has been diagnosed with chemical imbalance, depression, anxiety, alcoholism; respondent has been prescribed medications; respondent was committed a few weeks ago to [facility]; respondent drinks alcohol daily; respondent appears to be hallucinating and hearing voices; respondent broke the front door glass this evening, respondent is running through the neighborhood threatening to kill the neighbors and her family; respondent is a danger to self; respondent is a danger to others."   Patient is seen face to face today by this provider upon rounding. Chart/nursing notes reviewed.  Patient is sitting in the dayroom playing puzzles. She is cooperative upon approach. She is alert and oriented x 4. She appears healthy and well nourished. Thought process is coherent and goal directed. She denies SI/HI/AVH and does not appear to be preoccupied.  Her speech is clear and well articulated.  Patient admits to using substances including alcohol but minimizes the severity. She reports she has completed detox and wants to continue treatment in outpatient services. She reports she has been sober, using non-alcoholic drinks  "and I only drank alcohol one day, and dint control  my reactions, was on my period, .Aaron AasAaron AasI guess my parents don't want me home, and I understand its their house...". Patient admits she did wrong but wants to work on her problem through outpatient therapy.  She reports no withdrawal symptoms. Reports that her appetite is good and she has been sleeping well.   Collateral information from patient's mother Jamy Whyte [phone]   "[name] is struggling with substance use and we understand  and support her. She was recently treated at [facility] but, when she was discharged, she did not follow up with anyone. She started drinking again, and when she drinks, she becomes aggressive and unable to control her reactions. She does not say the truth about her drinking problem. [name] has been refusing rehab services and continues to repeat the same mistakes. Her dad stated she can not come home if she does not want to be compliant with family expectations. However, we love her and don't want her to go to the street".   Patient reports she has a few friends that she can stay with. However, she is unable to contact them because her phone is at home and she does not want to give her password to her parents.  She reports she would either go back to her parents house if they allow, and will start outpatient services as recommended. If not allowed to go back home, patient would like to go home and get her belongings including her phone, and will contact her friends for a short-term housing plan. Patient reports she was looking for employment and will continue to look upon discharge.      Total Time spent with patient: 45 minutes  Past Psychiatric History: PTSD,  GAD, MDD, Substance use Past Medical History: NA Family History: NA Family Psychiatric  History: NA Social History: Lives with parents. Unemployed. Has a daughter   Additional Social History:                         Sleep: Good  Appetite:  Good  Current Medications:  Current Facility-Administered  Medications  Medication Dose Route Frequency Provider Last Rate Last Admin   acetaminophen  (TYLENOL ) tablet 650 mg  650 mg Oral Q6H PRN Brent, Amanda C, NP       alum & mag hydroxide-simeth (MAALOX/MYLANTA) 200-200-20 MG/5ML suspension 30 mL  30 mL Oral Q4H PRN Brent, Amanda C, NP       haloperidol  (HALDOL ) tablet 5 mg  5 mg Oral TID PRN Brent, Amanda C, NP       And   diphenhydrAMINE  (BENADRYL ) capsule 50 mg  50 mg Oral TID PRN Brent, Amanda C, NP       haloperidol  lactate (HALDOL ) injection 5 mg  5 mg Intramuscular TID PRN Brent, Amanda C, NP       And   diphenhydrAMINE  (BENADRYL ) injection 50 mg  50 mg Intramuscular TID PRN Brent, Amanda C, NP       And   LORazepam  (ATIVAN ) injection 2 mg  2 mg Intramuscular TID PRN Brent, Amanda C, NP       haloperidol  lactate (HALDOL ) injection 10 mg  10 mg Intramuscular TID PRN Davia Erps, NP       And   diphenhydrAMINE  (BENADRYL ) injection 50 mg  50 mg Intramuscular TID PRN Brent, Amanda C, NP       And   LORazepam  (ATIVAN ) injection 2 mg  2 mg Intramuscular TID PRN Brent, Amanda C, NP       magnesium  hydroxide (MILK OF MAGNESIA) suspension 30 mL  30 mL Oral Daily PRN Brent, Amanda C, NP       mirtazapine  (REMERON ) tablet 15 mg  15 mg Oral QHS White, [name], NP   15 mg at 01/09/24 2338   multivitamin with minerals tablet 1 tablet  1 tablet Oral Daily Brent, Amanda C, NP   1 tablet at 01/10/24 1011   nicotine  (NICODERM CQ  - dosed in mg/24 hours) patch 21 mg  21 mg Transdermal Daily Brent, Amanda C, NP   21 mg at 01/10/24 1012   propranolol  (INDERAL ) tablet 10 mg  10 mg Oral TID PRN White, [name], NP   10 mg at 01/09/24 2338   thiamine  (VITAMIN B1) tablet 100 mg  100 mg Oral Daily Brent, Amanda C, NP   100 mg at 01/10/24 1012   Current Outpatient Medications  Medication Sig Dispense Refill   Aspirin-Salicylamide-Caffeine (BC HEADACHE POWDER PO) Take 1 packet by mouth 2 (two) times daily as needed (for headaches).     FLUoxetine  (PROZAC ) 10  MG capsule TAKE 1 CAPSULE BY MOUTH EVERY DAY 30 capsule 1   mirtazapine  (REMERON ) 15 MG tablet Take 15 mg by mouth at bedtime.     Nicotine  (NICODERM CQ  TD) Place 1 patch onto the skin daily as needed (for vaping cessation).     nicotine  polacrilex (NICORETTE ) 4 MG gum Take 4 mg by mouth as needed (for vaping/smoking cessation).     olmesartan  (BENICAR ) 20 MG tablet Take 1 tablet (20 mg total) by mouth daily. (Patient not taking: Reported on 01/06/2024) 30 tablet 5   propranolol  (INDERAL ) 10 MG tablet Take 1 tablet (10  mg total) by mouth 3 (three) times daily as needed (For anxiety.). 30 tablet 0    Labs  Lab Results:  Admission on 01/05/2024, Discharged on 01/06/2024  Component Date Value Ref Range Status   Sodium 01/06/2024 137  135 - 145 mmol/L Final   Potassium 01/06/2024 4.0  3.5 - 5.1 mmol/L Final   Chloride 01/06/2024 107  98 - 111 mmol/L Final   CO2 01/06/2024 20 (L)  22 - 32 mmol/L Final   Glucose, Bld 01/06/2024 116 (H)  70 - 99 mg/dL Final   Glucose reference range applies only to samples taken after fasting for at least 8 hours.   BUN 01/06/2024 <5 (L)  6 - 20 mg/dL Final   Creatinine, Ser 01/06/2024 0.46  0.44 - 1.00 mg/dL Final   Calcium 16/05/9603 9.6  8.9 - 10.3 mg/dL Final   Total Protein 54/04/8118 7.6  6.5 - 8.1 g/dL Final   Albumin 14/78/2956 4.2  3.5 - 5.0 g/dL Final   AST 21/30/8657 33  15 - 41 U/L Final   ALT 01/06/2024 48 (H)  0 - 44 U/L Final   Alkaline Phosphatase 01/06/2024 59  38 - 126 U/L Final   Total Bilirubin 01/06/2024 0.9  0.0 - 1.2 mg/dL Final   GFR, Estimated 01/06/2024 >60  >60 mL/min Final   Comment: (NOTE) Calculated using the CKD-EPI Creatinine Equation (2021)    Anion gap 01/06/2024 10  5 - 15 Final   Performed at [facility] [address], [place], [place] [address]   Alcohol, Ethyl (B) 01/06/2024 297 (H)  <15 mg/dL Final   Comment: Please note change in reference range. (NOTE) For medical purposes only. Performed at  [facility], [address], [place], [place] [address]    Opiates 01/06/2024 NONE DETECTED  NONE DETECTED Final   Cocaine 01/06/2024 NONE DETECTED  NONE DETECTED Final   Benzodiazepines 01/06/2024 NONE DETECTED  NONE DETECTED Final   Amphetamines 01/06/2024 NONE DETECTED  NONE DETECTED Final   Tetrahydrocannabinol 01/06/2024 POSITIVE (A)  NONE DETECTED Final   Barbiturates 01/06/2024 NONE DETECTED  NONE DETECTED Final   Comment: (NOTE) DRUG SCREEN FOR MEDICAL PURPOSES ONLY.  IF CONFIRMATION IS NEEDED FOR ANY PURPOSE, NOTIFY LAB WITHIN 5 DAYS.  LOWEST DETECTABLE LIMITS FOR URINE DRUG SCREEN Drug Class                     Cutoff (ng/mL) Amphetamine and metabolites    1000 Barbiturate and metabolites    200 Benzodiazepine                 200 Opiates and metabolites        300 Cocaine and metabolites        300 THC                            50 Performed at [facility], [address], [place], [place] [address]    WBC 01/06/2024 7.3  4.0 - 10.5 K/uL Final   RBC 01/06/2024 4.56  3.87 - 5.11 MIL/uL Final   Hemoglobin 01/06/2024 15.0  12.0 - 15.0 g/dL Final   HCT 40/05/2724 43.3  36.0 - 46.0 % Final   MCV 01/06/2024 95.0  80.0 - 100.0 fL Final   MCH 01/06/2024 32.9  26.0 - 34.0 pg Final   MCHC 01/06/2024 34.6  30.0 - 36.0 g/dL Final   RDW 36/64/4034 11.5  11.5 - 15.5 % Final   Platelets 01/06/2024 362  150 - 400 K/uL Final   nRBC 01/06/2024 0.0  0.0 - 0.2 % Final   Neutrophils Relative % 01/06/2024 61  % Final   Neutro Abs 01/06/2024 4.5  1.7 - 7.7 K/uL Final   Lymphocytes Relative 01/06/2024 28  % Final   Lymphs Abs 01/06/2024 2.0  0.7 - 4.0 K/uL Final   Monocytes Relative 01/06/2024 9  % Final   Monocytes Absolute 01/06/2024 0.6  0.1 - 1.0 K/uL Final   Eosinophils Relative 01/06/2024 1  % Final   Eosinophils Absolute 01/06/2024 0.1  0.0 - 0.5 K/uL Final   Basophils Relative 01/06/2024 0  % Final   Basophils Absolute 01/06/2024 0.0   0.0 - 0.1 K/uL Final   Immature Granulocytes 01/06/2024 1  % Final   Abs Immature Granulocytes 01/06/2024 0.04  0.00 - 0.07 K/uL Final   Performed at [facility], [address] [place], [place] [address]   Preg, Serum 01/06/2024 NEGATIVE  NEGATIVE Final   Comment:        THE SENSITIVITY OF THIS METHODOLOGY IS >10 mIU/mL. Performed at [facility], [address] [place], [place] [address]     Blood Alcohol level:  Lab Results  Component Value Date   ETH 297 (H) 01/06/2024    Metabolic Disorder Labs: Lab Results  Component Value Date   HGBA1C 5.0 02/15/2019   No results found for: "PROLACTIN" No results found for: "CHOL", "TRIG", "HDL", "CHOLHDL", "VLDL", "LDLCALC"  Therapeutic Lab Levels: No results found for: "LITHIUM" No results found for: "VALPROATE" No results found for: "CBMZ"  Physical Findings   AUDIT    Flowsheet Row ED from 01/07/2024 in [facility]  Alcohol Use Disorder Identification Test Final Score (AUDIT) 11      GAD-7    Flowsheet Row Office Visit from 11/09/2022 in [facility] Video Visit from 02/08/2019 in Center for [facility]  Total GAD-7 Score 16 3      PHQ2-9    Flowsheet Row ED from 01/07/2024 in [facility] Office Visit from 11/09/2022 in [facility] Video Visit from 02/08/2019 in Center for [facility]  PHQ-2 Total Score 2 1 0  PHQ-9 Total Score 6 -- 3      Flowsheet Row ED from 01/07/2024 in [facility] Most recent reading at 01/07/2024 10:26 AM ED from 01/06/2024 in [facility] Most recent reading at 01/06/2024 11:46 PM ED from 01/06/2024 in [facility] Most recent reading at 01/06/2024 10:09 PM  C-SSRS RISK CATEGORY No Risk No Risk No Risk         Musculoskeletal  Strength & Muscle Tone: within normal limits Gait & Station: normal Patient leans: N/A  Psychiatric Specialty Exam  Presentation  General Appearance:  Casual  Eye Contact: Fair  Speech: Clear and Coherent  Speech Volume: Normal  Handedness: Right   Mood and Affect  Mood: Anxious  Affect: Congruent   Thought Process  Thought Processes: Coherent  Descriptions of Associations:Intact  Orientation:Full (Time, Place and Person)  Thought Content:WDL     Hallucinations:Hallucinations: None  Ideas of Reference:None  Suicidal Thoughts:Suicidal Thoughts: No  Homicidal Thoughts:Homicidal Thoughts: No   Sensorium  Memory: Immediate Fair; Recent Fair; Remote Fair  Judgment: Fair  Insight: Fair   Art therapist  Concentration: Fair  Attention Span: Fair  Recall: [employer] of Knowledge: Fair  Language: Fair   Psychomotor Activity  Psychomotor Activity: Psychomotor Activity: Normal   Assets  Assets: Manufacturing systems engineer; Desire for Improvement; Physical Health   Sleep  Sleep: Sleep: Fair   Nutritional Assessment (For OBS and FBC admissions only) Has the patient had a weight loss or gain of 10 pounds or more in the last 3 months?: No Has the patient had a decrease in food intake/or appetite?: No Does the patient have dental problems?: No Does the patient have eating habits or behaviors that may be indicators of an eating disorder including binging or inducing vomiting?: No Has the patient recently lost weight without trying?: 0 Has the patient been eating poorly because of a decreased appetite?: 0 Malnutrition Screening Tool Score: 0    Physical Exam  Physical Exam Vitals and nursing note reviewed.  Constitutional:      Appearance: Normal appearance.  HENT:     Head: Normocephalic and atraumatic.     Left Ear: Tympanic membrane normal.     Nose: Nose normal.  Eyes:     Extraocular Movements:  Extraocular movements intact.     Pupils: Pupils are equal, round, and reactive to light.  Cardiovascular:     Rate and Rhythm: Normal rate.  Pulmonary:     Effort: Pulmonary effort is normal.  Musculoskeletal:        General: Normal range of motion.     Cervical back: Normal range of motion and neck supple.  Neurological:     General: No focal deficit present.     Mental Status: She is alert and oriented to person, place, and time.    Review of Systems  Constitutional: Negative.   HENT: Negative.    Eyes: Negative.   Respiratory: Negative.    Gastrointestinal: Negative.   Genitourinary: Negative.   Musculoskeletal: Negative.   Skin: Negative.   Neurological: Negative.   Psychiatric/Behavioral:  Positive for substance abuse.    Blood pressure 138/85, pulse 70, temperature 98.2 F (36.8 C), temperature source Oral, resp. rate 16, SpO2 100%. There is no height or weight on file to calculate BMI.  Treatment Plan Summary: Daily contact with patient to assess and evaluate symptoms and progress in treatment, Medication management, and disposition in process  Elston Halsted, NP 01/10/2024 11:26 AM

## 2024-01-10 NOTE — ED Notes (Signed)
 Pt has been observed in milieu this shift. Coloring and watching TV.  Behaviors appropriate. Q 15 minute observations for safety continue

## 2024-01-10 NOTE — ED Notes (Signed)
 Pt observable in milieu.   Interacting with peers appropriate behaviors. Denied current SI plan and intent,    Q 15 minute observation for safety continue

## 2024-01-11 DIAGNOSIS — F4323 Adjustment disorder with mixed anxiety and depressed mood: Secondary | ICD-10-CM | POA: Diagnosis not present

## 2024-01-11 DIAGNOSIS — Z79899 Other long term (current) drug therapy: Secondary | ICD-10-CM | POA: Diagnosis not present

## 2024-01-11 DIAGNOSIS — R443 Hallucinations, unspecified: Secondary | ICD-10-CM | POA: Diagnosis not present

## 2024-01-11 NOTE — ED Notes (Signed)
 Patient discharged home per MD order. After Visit Summary (AVS) printed and given to patient. AVS reviewed with patient and all questions fully answered. Patient discharged in no acute distress, A& O x4 and ambulatory. Patient denied SI/HI, A/VH upon discharge. Patient verbalized understanding of all discharge instructions explained by staff, including follow up appointments, RX's and safety plan. Patient mood fair. Patient belongings returned to patient from locker #1 complete and intact. Patient escorted to lobby via staff for transport to destination. Safety maintained.

## 2024-01-11 NOTE — ED Notes (Signed)
 Pt has been observable in the milieu thus far this shift.  Interactions with staff and peers are appropriate.  Calm and cooperative. Denied current SI plan and intent Q 15 minute observations for safety continue

## 2024-01-11 NOTE — Group Note (Signed)
 Group Topic: Relapse and Recovery  Group Date: 01/11/2024 Start Time: 1200 End Time: 1211 Facilitators: Jonnathan Birman, Arbutus Knoll, RN  Department: [facility]  Number of Participants: 1  Group Focus: discharge education Treatment Modality:  Individual Therapy Interventions utilized were patient education Purpose: increase insight  Name: Kathy Guerrero Date of Birth: 13-Jul-1991  MR: [id number]    Level of Participation: active Quality of Participation: attentive, cooperative, and engaged Interactions with others: gave feedback Mood/Affect: appropriate Triggers (if applicable): None identified at this time Cognition: coherent/clear, insightful, and logical Progress: Significant Response: Patient voiced understanding of all d/c instructions as presented to her. Suicide safety plan reviewed as well as AVS. No questions voiced at this time, contact information reviewed for any future questions. Follow up care reviewed.  Plan: patient will be encouraged to refer to suicide safety plan or 988 contact information if needed, follow up with OP resources, reach out for any needed support.  Patients Problems:  Patient Active Problem List   Diagnosis Date Noted   Alcohol use disorder 01/07/2024   MDD (major depressive disorder) 01/06/2024   Primary hypertension 11/09/2022   Generalized anxiety disorder 11/09/2022   Vitamin D  deficiency 02/20/2019

## 2024-01-11 NOTE — ED Provider Notes (Signed)
 FBC/OBS ASAP Discharge Summary  Date and Time: 01/11/2024 10:14 AM  Name: Kathy Guerrero  MRN:  [id number]   Discharge Diagnoses:  Final diagnoses:  Involuntary commitment  Adjustment disorder with mixed anxiety and depressed mood    Subjective: "I am ready to work on myself"  Stay Summary: Kathy Guerrero is a 33 year old female patient with a documented history of alcohol abuse and GAD and a reported history of postpartum depression, and anxiety who presented to the [facility] behavioral health urgent care under involuntary commitment after she was transferred from [facility] emergency department under IVC and was recommended to be admitted to the facility-based crisis center. BAL was 297. UDS pos for THC.   The IVC stated "Respondent has been diagnosed with chemical imbalance, depression, anxiety, alcoholism; respondent has been prescribed medications; respondent was committed a few weeks ago to [facility]; respondent drinks alcohol daily; respondent appears to be hallucinating and hearing voices; respondent broke the front door glass this evening, respondent is running through the neighborhood threatening to kill the neighbors and her family; respondent is a danger to self; respondent is a danger to others."    Patient is seen face to face today by this provider upon rounding. Chart/nursing notes reviewed.  Patient is sitting in the dayroom playing puzzles. She is cooperative upon approach. She is alert and oriented x 4. She appears healthy and well nourished. Thought process is coherent and goal directed. She denies SI/HI/AVH and does not appear to be preoccupied.  Her speech is clear and well articulated.  Patient admits to using substances including alcohol but minimizes and states she is willing and motivated to work on sobriety "I have a daughter to raise". She reports she has completed detox and wants to continue treatment in outpatient services. She reports she  has been sober, using non-alcoholic drinks  "and I only drank alcohol one day, and dint control my reactions, was on my period, .Kathy AasAaron AasI guess my parents don't want me home, and I understand its their house...". Patient admits she did wrong but wants to work on her problem through outpatient therapy.  She reports no withdrawal symptoms. Reports that her appetite is good and she has been sleeping well.    Collateral information from patient's mother [name] [phone]   "[name] is struggling with substance use and we understand  and support her. She was recently treated at [facility] but, when she was discharged, she did not follow up with anyone. She started drinking again, and when she drinks, she becomes aggressive and unable to control her reactions. She does not say the truth about her drinking problem. [name] has been refusing rehab services and continues to repeat the same mistakes. Her dad stated she can not come home if she does not want to be compliant with family expectations. However, we love her and don't want her to go to the street".    Patient reports she has a few friends that she can stay with. However, she is unable to contact them because her phone is at home and she does not want to give her password to her parents.  She reports she would either go back to her parents house if they allow, and will start outpatient services as recommended. If not allowed to go back home, patient would like to go home and get her belongings including her phone, and will contact her friends for a short-term housing plan. Patient reports she was looking for employment and will  continue to look upon discharge.   Patient's parents list their expectations from patient if she wants to stay at the home and ask provider to discuss it before discharge. Patient is in agreement with the expectations including  not using alcohol, finding employment, decreasing social media use and getting help through therapy and medication  management. Patient states she is willing to work on herself and will respect family expectations. She is discharged with no sign of distress. Outpatient services recommended and referrals provided.       Total Time spent with patient: 1 hour  Past Psychiatric History: PTSD, MDD, GAD Past Medical History: NA Family History: NA Family Psychiatric History: NA Social History: Lives with parents. Unemployed. Has a daughter  Tobacco Cessation:  A prescription for an FDA-approved tobacco cessation medication provided at discharge  Current Medications:  Current Facility-Administered Medications  Medication Dose Route Frequency Provider Last Rate Last Admin   acetaminophen  (TYLENOL ) tablet 650 mg  650 mg Oral Q6H PRN Brent, Amanda C, NP       alum & mag hydroxide-simeth (MAALOX/MYLANTA) 200-200-20 MG/5ML suspension 30 mL  30 mL Oral Q4H PRN Brent, Amanda C, NP       haloperidol  (HALDOL ) tablet 5 mg  5 mg Oral TID PRN Brent, Amanda C, NP       And   diphenhydrAMINE  (BENADRYL ) capsule 50 mg  50 mg Oral TID PRN Brent, Amanda C, NP       haloperidol  lactate (HALDOL ) injection 5 mg  5 mg Intramuscular TID PRN Brent, Amanda C, NP       And   diphenhydrAMINE  (BENADRYL ) injection 50 mg  50 mg Intramuscular TID PRN Brent, Amanda C, NP       And   LORazepam  (ATIVAN ) injection 2 mg  2 mg Intramuscular TID PRN Brent, Amanda C, NP       haloperidol  lactate (HALDOL ) injection 10 mg  10 mg Intramuscular TID PRN Davia Erps, NP       And   diphenhydrAMINE  (BENADRYL ) injection 50 mg  50 mg Intramuscular TID PRN Brent, Amanda C, NP       And   LORazepam  (ATIVAN ) injection 2 mg  2 mg Intramuscular TID PRN Brent, Amanda C, NP       magnesium  hydroxide (MILK OF MAGNESIA) suspension 30 mL  30 mL Oral Daily PRN Brent, Amanda C, NP       mirtazapine  (REMERON ) tablet 15 mg  15 mg Oral QHS [name], [name], NP   15 mg at 01/10/24 2221   multivitamin with minerals tablet 1 tablet  1 tablet Oral Daily Brent, Amanda  C, NP   1 tablet at 01/11/24 2130   nicotine  (NICODERM CQ  - dosed in mg/24 hours) patch 21 mg  21 mg Transdermal Daily Brent, Amanda C, NP   21 mg at 01/11/24 8657   propranolol  (INDERAL ) tablet 10 mg  10 mg Oral TID PRN [name], [name], NP   10 mg at 01/10/24 2221   thiamine  (VITAMIN B1) tablet 100 mg  100 mg Oral Daily Brent, Amanda C, NP   100 mg at 01/11/24 8469   Current Outpatient Medications  Medication Sig Dispense Refill   Aspirin-Salicylamide-Caffeine (BC HEADACHE POWDER PO) Take 1 packet by mouth 2 (two) times daily as needed (for headaches).     FLUoxetine  (PROZAC ) 10 MG capsule TAKE 1 CAPSULE BY MOUTH EVERY DAY 30 capsule 1   mirtazapine  (REMERON ) 15 MG tablet Take 15 mg by mouth at  bedtime.     Nicotine  (NICODERM CQ  TD) Place 1 patch onto the skin daily as needed (for vaping cessation).     nicotine  polacrilex (NICORETTE ) 4 MG gum Take 4 mg by mouth as needed (for vaping/smoking cessation).     olmesartan  (BENICAR ) 20 MG tablet Take 1 tablet (20 mg total) by mouth daily. (Patient not taking: Reported on 01/06/2024) 30 tablet 5   propranolol  (INDERAL ) 10 MG tablet Take 1 tablet (10 mg total) by mouth 3 (three) times daily as needed (For anxiety.). 30 tablet 0    PTA Medications:  Facility Ordered Medications  Medication   nicotine  (NICODERM CQ  - dosed in mg/24 hours) patch 21 mg   acetaminophen  (TYLENOL ) tablet 650 mg   alum & mag hydroxide-simeth (MAALOX/MYLANTA) 200-200-20 MG/5ML suspension 30 mL   magnesium  hydroxide (MILK OF MAGNESIA) suspension 30 mL   haloperidol  (HALDOL ) tablet 5 mg   And   diphenhydrAMINE  (BENADRYL ) capsule 50 mg   haloperidol  lactate (HALDOL ) injection 5 mg   And   diphenhydrAMINE  (BENADRYL ) injection 50 mg   And   LORazepam  (ATIVAN ) injection 2 mg   haloperidol  lactate (HALDOL ) injection 10 mg   And   diphenhydrAMINE  (BENADRYL ) injection 50 mg   And   LORazepam  (ATIVAN ) injection 2 mg   multivitamin with minerals tablet 1 tablet   [EXPIRED]  chlordiazePOXIDE  (LIBRIUM ) capsule 25 mg   [EXPIRED] hydrOXYzine  (ATARAX ) tablet 25 mg   [EXPIRED] loperamide  (IMODIUM ) capsule 2-4 mg   [EXPIRED] ondansetron  (ZOFRAN -ODT) disintegrating tablet 4 mg   thiamine  (VITAMIN B1) tablet 100 mg   mirtazapine  (REMERON ) tablet 15 mg   propranolol  (INDERAL ) tablet 10 mg   PTA Medications  Medication Sig   olmesartan  (BENICAR ) 20 MG tablet Take 1 tablet (20 mg total) by mouth daily. (Patient not taking: Reported on 01/06/2024)   propranolol  (INDERAL ) 10 MG tablet Take 1 tablet (10 mg total) by mouth 3 (three) times daily as needed (For anxiety.).   FLUoxetine  (PROZAC ) 10 MG capsule TAKE 1 CAPSULE BY MOUTH EVERY DAY   mirtazapine  (REMERON ) 15 MG tablet Take 15 mg by mouth at bedtime.   Nicotine  (NICODERM CQ  TD) Place 1 patch onto the skin daily as needed (for vaping cessation).   nicotine  polacrilex (NICORETTE ) 4 MG gum Take 4 mg by mouth as needed (for vaping/smoking cessation).   Aspirin-Salicylamide-Caffeine (BC HEADACHE POWDER PO) Take 1 packet by mouth 2 (two) times daily as needed (for headaches).       01/11/2024   10:13 AM 01/10/2024   11:25 AM 01/07/2024    1:14 PM  Depression screen PHQ 2/9  Decreased Interest 1 1 1   Down, Depressed, Hopeless 1 1 1   PHQ - 2 Score 2 2 2   Altered sleeping 1 1 1   Tired, decreased energy 1 1 1   Change in appetite 0 0 0  Feeling bad or failure about yourself  0 0 0  Trouble concentrating 1 1 1   Moving slowly or fidgety/restless 1 1 1   Suicidal thoughts 0 0 0  PHQ-9 Score 6 6 6   Difficult doing work/chores Somewhat difficult Not difficult at all Not difficult at all    [facility] ED from 01/07/2024 in [facility] Most recent reading at 01/07/2024 10:26 AM ED from 01/06/2024 in [facility] Most recent reading at 01/06/2024 11:46 PM ED from 01/06/2024 in [facility] Most recent reading at 01/06/2024 10:09 PM  C-SSRS RISK  CATEGORY No Risk No  Risk No Risk       Musculoskeletal  Strength & Muscle Tone: within normal limits Gait & Station: normal Patient leans: N/A  Psychiatric Specialty Exam  Presentation  General Appearance:  Casual  Eye Contact: Fair  Speech: Clear and Coherent  Speech Volume: Normal  Handedness: Right   Mood and Affect  Mood: Euthymic  Affect: Appropriate   Thought Process  Thought Processes: Coherent  Descriptions of Associations:Intact  Orientation:Full (Time, Place and Person)  Thought Content:Logical     Hallucinations:Hallucinations: None  Ideas of Reference:None  Suicidal Thoughts:Suicidal Thoughts: No  Homicidal Thoughts:Homicidal Thoughts: No   Sensorium  Memory: Immediate Fair; Recent Fair; Remote Fair  Judgment: Fair  Insight: Fair   Art therapist  Concentration: Fair  Attention Span: Fair  Recall: [employer] of Knowledge: Fair  Language: Fair   Psychomotor Activity  Psychomotor Activity: Psychomotor Activity: Normal   Assets  Assets: Communication Skills; Desire for Improvement; Physical Health; Social Support   Sleep  Sleep: Sleep: Good Number of Hours of Sleep: 7   Nutritional Assessment (For OBS and FBC admissions only) Has the patient had a weight loss or gain of 10 pounds or more in the last 3 months?: No Has the patient had a decrease in food intake/or appetite?: No Does the patient have dental problems?: No Does the patient have eating habits or behaviors that may be indicators of an eating disorder including binging or inducing vomiting?: No Has the patient recently lost weight without trying?: 0 Has the patient been eating poorly because of a decreased appetite?: 0 Malnutrition Screening Tool Score: 0    Physical Exam  Physical Exam Vitals and nursing note reviewed.  Constitutional:      Appearance: Normal appearance.  HENT:     Head: Normocephalic and atraumatic.     Right  Ear: Tympanic membrane normal.     Left Ear: Tympanic membrane normal.     Nose: Nose normal.     Mouth/Throat:     Mouth: Mucous membranes are dry.  Eyes:     Extraocular Movements: Extraocular movements intact.     Pupils: Pupils are equal, round, and reactive to light.  Cardiovascular:     Rate and Rhythm: Normal rate.     Pulses: Normal pulses.  Pulmonary:     Effort: Pulmonary effort is normal.  Musculoskeletal:        General: Normal range of motion.     Cervical back: Normal range of motion and neck supple.  Neurological:     General: No focal deficit present.     Mental Status: She is alert and oriented to person, place, and time.  Psychiatric:        Behavior: Behavior normal.        Thought Content: Thought content normal.        Judgment: Judgment normal.    Review of Systems  Constitutional: Negative.   HENT: Negative.    Eyes: Negative.   Respiratory: Negative.    Cardiovascular: Negative.   Gastrointestinal: Negative.   Genitourinary: Negative.   Musculoskeletal: Negative.   Skin: Negative.   Neurological: Negative.   Endo/Heme/Allergies: Negative.   Psychiatric/Behavioral:  Positive for depression and substance abuse.    Blood pressure 126/62, pulse 77, temperature 98.3 F (36.8 C), temperature source Oral, resp. rate 17, SpO2 100%. There is no height or weight on file to calculate BMI.  Demographic Factors:  Caucasian and Unemployed  Loss Factors: Financial problems/change in socioeconomic status  Historical  Factors: NA  Risk Reduction Factors:   Responsible for children under 50 years of age, Living with another person, especially a relative, and Positive social support  Continued Clinical Symptoms:  Depression:   Comorbid alcohol abuse/dependence Previous Psychiatric Diagnoses and Treatments  Cognitive Features That Contribute To Risk:  None    Suicide Risk:  Minimal: No identifiable suicidal ideation.  Patients presenting with no risk  factors but with morbid ruminations; may be classified as minimal risk based on the severity of the depressive symptoms  Plan Of Care/Follow-up recommendations:  Activity:  As tolerated Diet:  Regular  Disposition: Discharge to current settings (Home with family).   Elston Halsted, NP 01/11/2024, 10:14 AM

## 2024-02-01 ENCOUNTER — Encounter

## 2024-02-03 DIAGNOSIS — Z419 Encounter for procedure for purposes other than remedying health state, unspecified: Secondary | ICD-10-CM | POA: Diagnosis not present

## 2024-09-12 ENCOUNTER — Ambulatory Visit: Payer: Self-pay | Admitting: Obstetrics
